# Patient Record
Sex: Female | Born: 1955 | Race: White | Hispanic: No | Marital: Married | Smoking: Never smoker
Health system: Southern US, Community
[De-identification: ages and names within clinical notes are randomized; demographics above are authoritative.]

## PROBLEM LIST (undated history)

## (undated) DIAGNOSIS — K900 Celiac disease: Secondary | ICD-10-CM

## (undated) DIAGNOSIS — R112 Nausea with vomiting, unspecified: Secondary | ICD-10-CM

## (undated) DIAGNOSIS — K9041 Non-celiac gluten sensitivity: Secondary | ICD-10-CM

## (undated) DIAGNOSIS — F419 Anxiety disorder, unspecified: Secondary | ICD-10-CM

## (undated) DIAGNOSIS — K219 Gastro-esophageal reflux disease without esophagitis: Secondary | ICD-10-CM

## (undated) DIAGNOSIS — J449 Chronic obstructive pulmonary disease, unspecified: Secondary | ICD-10-CM

## (undated) DIAGNOSIS — Z8719 Personal history of other diseases of the digestive system: Secondary | ICD-10-CM

## (undated) DIAGNOSIS — E038 Other specified hypothyroidism: Secondary | ICD-10-CM

## (undated) DIAGNOSIS — M810 Age-related osteoporosis without current pathological fracture: Secondary | ICD-10-CM

## (undated) DIAGNOSIS — N393 Stress incontinence (female) (male): Secondary | ICD-10-CM

## (undated) DIAGNOSIS — Z9889 Other specified postprocedural states: Secondary | ICD-10-CM

## (undated) DIAGNOSIS — L709 Acne, unspecified: Secondary | ICD-10-CM

## (undated) DIAGNOSIS — M79671 Pain in right foot: Secondary | ICD-10-CM

## (undated) DIAGNOSIS — Z86010 Personal history of colonic polyps: Secondary | ICD-10-CM

## (undated) DIAGNOSIS — N183 Chronic kidney disease, stage 3 unspecified: Secondary | ICD-10-CM

## (undated) DIAGNOSIS — Z905 Acquired absence of kidney: Secondary | ICD-10-CM

## (undated) DIAGNOSIS — Z8739 Personal history of other diseases of the musculoskeletal system and connective tissue: Secondary | ICD-10-CM

## (undated) DIAGNOSIS — M1990 Unspecified osteoarthritis, unspecified site: Secondary | ICD-10-CM

## (undated) DIAGNOSIS — E079 Disorder of thyroid, unspecified: Secondary | ICD-10-CM

## (undated) DIAGNOSIS — L719 Rosacea, unspecified: Secondary | ICD-10-CM

## (undated) DIAGNOSIS — Z789 Other specified health status: Secondary | ICD-10-CM

## (undated) HISTORY — DX: Anxiety disorder, unspecified: F41.9

## (undated) HISTORY — DX: Personal history of other diseases of the digestive system: Z87.19

## (undated) HISTORY — DX: Unspecified osteoarthritis, unspecified site: M19.90

## (undated) HISTORY — PX: NEPHRECTOMY: SHX65

## (undated) HISTORY — DX: Celiac disease: K90.0

## (undated) HISTORY — DX: Other specified postprocedural states: Z98.890

## (undated) HISTORY — PX: COLONOSCOPY: SHX174

## (undated) HISTORY — DX: Disorder of thyroid, unspecified: E07.9

## (undated) HISTORY — PX: TOTAL HIP ARTHROPLASTY: SHX124

## (undated) HISTORY — PX: BUNIONECTOMY: SHX129

---
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
## 2000-03-26 HISTORY — PX: HERNIA REPAIR: SHX51

## 2001-01-07 ENCOUNTER — Encounter (INDEPENDENT_AMBULATORY_CARE_PROVIDER_SITE_OTHER): Payer: Self-pay | Admitting: *Deleted

## 2001-01-07 ENCOUNTER — Ambulatory Visit (HOSPITAL_BASED_OUTPATIENT_CLINIC_OR_DEPARTMENT_OTHER): Admission: RE | Admit: 2001-01-07 | Discharge: 2001-01-07 | Payer: Self-pay | Admitting: Surgery

## 2002-04-28 ENCOUNTER — Emergency Department (HOSPITAL_COMMUNITY): Admission: EM | Admit: 2002-04-28 | Discharge: 2002-04-29 | Payer: Self-pay | Admitting: Emergency Medicine

## 2002-04-28 ENCOUNTER — Encounter: Payer: Self-pay | Admitting: Emergency Medicine

## 2004-05-02 ENCOUNTER — Other Ambulatory Visit: Admission: RE | Admit: 2004-05-02 | Discharge: 2004-05-02 | Payer: Self-pay | Admitting: Interventional Radiology

## 2004-05-02 ENCOUNTER — Encounter: Admission: RE | Admit: 2004-05-02 | Discharge: 2004-05-02 | Payer: Self-pay | Admitting: Internal Medicine

## 2006-07-12 ENCOUNTER — Encounter: Admission: RE | Admit: 2006-07-12 | Discharge: 2006-07-12 | Payer: Self-pay | Admitting: Internal Medicine

## 2006-08-13 ENCOUNTER — Emergency Department (HOSPITAL_COMMUNITY): Admission: EM | Admit: 2006-08-13 | Discharge: 2006-08-13 | Payer: Self-pay | Admitting: Emergency Medicine

## 2010-08-11 NOTE — Op Note (Signed)
[place]  Patient:    Regina Reyes, Regina Reyes Visit Number: [id number] MRN: [id number]          Service Type: DSU Location: [facility] Attending Physician:  Katha Cabal Dictated by:   Thornton Park Daphine Deutscher, M.D. Proc. Date: 01/07/01 Admit Date:  01/07/2001   CC:         Dr. Merlinda Frederick   Operative Report  PREOPERATIVE DIAGNOSIS:  Right inguinal hernia.  POSTOPERATIVE DIAGNOSIS:  Right indirect inguinal hernia.  OPERATION PERFORMED:  Open right inguinal herniorrhaphy with Sofradim mesh.  SURGEON:  Thornton Park. Daphine Deutscher, M.D.  ANESTHESIA:  MAC.  INDICATIONS FOR PROCEDURE:  The patient is a is a 55 year old lady who presented with a history of a bulging mass in her right groin.  This was identified and marked preoperatively.  DESCRIPTION OF PROCEDURE:  She was taken back to room three [facility] on January 07, 2001 and the entire area was prepped with Betadine and draped sterilely.  A regional block was administered using a mixture of Marcaine and lidocaine.  A small oblique incision was made in the right inguinal region and carried down through the fatty tissue to the external oblique which were then incised along their fibers to the ring.  I mobilized what was very visible as a prominent cord structure, very thickened and put a Penrose drain around it.  I then went proximally and found a very large sac which I eventually dissected free from the gubernaculum and then opened it and inserted my finger and felt the floor.  I then excised the excess of this as I closed it using a running 2-0 silk and then I twisted it and then pexed it again with the 2-0 silk.  The excess sac was removed and it was then allowed to retract up into the abdomen.  There was no bleeding.  I then identified the ilioinguinal nerve branch which had been separated from the cord structures and then divided the gubernaculum and allowed the  proximal gubernaculum to go up inside the abdomen.  I then closed the ring with a running 2-0 Prolene.  The distal gubernaculum had a suture ligature placed in it with 2-0 silk.  The floor did have sort of a bulging appearance to it and I went ahead and put a piece of Sofradim mesh to cover both the floor of the inguinal canal as well as the closure site.  This was cut to fit and sutured along the inguinal ligament with running 2-0 Prolene and medially to the internal oblique muscle and then carried out over the ring.  Next the external ring was closed again avoiding encumbrance on the nerve which was again identified.  The 2-0 Vicryl closed the external oblique.  The area had been irrigated well with saline.  4-0 Vicryl was used subcutaneously, subcuticularly and a 5-0 subcuticular interrupted closure was done.  Benzoin and Steri-Strips were used on the skin.  The patient will be given Percocet 10/325 to take for pain and will be followed up in the office in two to three weeks. Dictated by:   Thornton Park Daphine Deutscher, M.D. Attending Physician:  Katha Cabal DD:  01/07/01 TD:  01/07/01 Job: [id number]

## 2011-03-09 ENCOUNTER — Telehealth (INDEPENDENT_AMBULATORY_CARE_PROVIDER_SITE_OTHER): Payer: Self-pay | Admitting: Surgery

## 2011-04-06 ENCOUNTER — Ambulatory Visit (INDEPENDENT_AMBULATORY_CARE_PROVIDER_SITE_OTHER): Payer: Managed Care, Other (non HMO) | Admitting: Surgery

## 2011-04-06 ENCOUNTER — Encounter (INDEPENDENT_AMBULATORY_CARE_PROVIDER_SITE_OTHER): Payer: Self-pay | Admitting: Surgery

## 2011-04-06 VITALS — BP 126/88 | HR 64 | Temp 97.4°F | Resp 20 | Ht 72.0 in | Wt 207.0 lb

## 2011-04-06 DIAGNOSIS — G5790 Unspecified mononeuropathy of unspecified lower limb: Secondary | ICD-10-CM

## 2011-04-06 NOTE — Progress Notes (Signed)
PREOPERATIVE DIAGNOSIS: Right inguinal hernia.  POSTOPERATIVE DIAGNOSIS: Right indirect inguinal hernia.  OPERATION PERFORMED: Open right inguinal herniorrhaphy with Sofradim mesh.  SURGEON: Thornton Park. Daphine Deutscher, M.D. Regina Reyes returns today with pain in her right groin his side or I did a right inguinal herniorrhaphy for an indirect hernia and repaired this Sophridim mesh.  This was done in combination with ligating her gubernaculum and closing the ring. Now she has pain and can no longer cross her legs and is like she has nerve impingement. I can't feel any hernia recurrence when I examined her. I want to get a CT scan abdomen and pelvis to make sure that we're not missing something else. I will see her back after that. Plan CT abdomen and pelvis 4 right inguinal neuropathy.

## 2011-04-12 ENCOUNTER — Ambulatory Visit
Admission: RE | Admit: 2011-04-12 | Discharge: 2011-04-12 | Disposition: A | Discharge status: Home / Self Care | Payer: Managed Care, Other (non HMO) | Source: Ambulatory Visit | Attending: Surgery | Admitting: Surgery

## 2011-04-12 DIAGNOSIS — G5790 Unspecified mononeuropathy of unspecified lower limb: Secondary | ICD-10-CM

## 2011-04-12 MED ORDER — IOHEXOL 300 MG/ML  SOLN
125.0000 mL | Freq: Once | INTRAMUSCULAR | Status: AC | PRN
  Administered 2011-04-12: 125 mL via INTRAVENOUS

## 2011-04-18 ENCOUNTER — Telehealth (INDEPENDENT_AMBULATORY_CARE_PROVIDER_SITE_OTHER): Payer: Self-pay | Admitting: General Surgery

## 2011-04-18 NOTE — Telephone Encounter (Signed)
Notified the patient of CT report, she requested a copy, sent to her via mail

## 2012-01-25 ENCOUNTER — Ambulatory Visit (INDEPENDENT_AMBULATORY_CARE_PROVIDER_SITE_OTHER): Payer: Managed Care, Other (non HMO) | Admitting: Emergency Medicine

## 2012-01-25 VITALS — BP 128/80 | HR 105 | Temp 102.4°F | Resp 16 | Ht 72.0 in | Wt 199.8 lb

## 2012-01-25 DIAGNOSIS — J018 Other acute sinusitis: Secondary | ICD-10-CM

## 2012-01-25 DIAGNOSIS — J209 Acute bronchitis, unspecified: Secondary | ICD-10-CM

## 2012-01-25 DIAGNOSIS — J019 Acute sinusitis, unspecified: Secondary | ICD-10-CM

## 2012-01-25 MED ORDER — HYDROCOD POLST-CHLORPHEN POLST 10-8 MG/5ML PO LQCR: 5.0000 mL | Freq: Two times a day (BID) | ORAL | Status: DC | PRN

## 2012-01-25 MED ORDER — AMOXICILLIN-POT CLAVULANATE 875-125 MG PO TABS: 1.0000 | ORAL_TABLET | Freq: Two times a day (BID) | ORAL | Status: DC

## 2012-01-25 MED ORDER — PSEUDOEPHEDRINE-GUAIFENESIN ER 60-600 MG PO TB12: 1.0000 | ORAL_TABLET | Freq: Two times a day (BID) | ORAL | Status: AC

## 2012-01-25 NOTE — Progress Notes (Signed)
[facility] [address] [place] [address] [phone]  Date:  01/25/2012   Name:  Regina Reyes   DOB:  06-Jul-1955   MRN:  [id number]  PCP:  Gaspar Garbe, MD    Chief Complaint: Fever and Cough   History of Present Illness:  Regina Reyes is a 56 y.o. very pleasant female patient who presents with the following:  Ill since Wednesday night with a cough that was nonproductive.  Had fever and chills, not recorded.  Yesterday developed sore throat and hoarseness and purulent nasal congestion and drainage.  Pressure and pain in maxillary sinuses.  Worse when coughs.  Has generalized malaise and myalgias.    There is no problem list on file for this patient.   Past Medical History  Diagnosis Date  . History of inguinal hernia repair   . Thyroid disease   . Celiac syndrome   . Arthritis   . Anxiety     Past Surgical History  Procedure Date  . Appendectomy 1973  . Hernia repair 2002    RIH  . Colonoscopy 2004, 2009, 2012    History  Substance Use Topics  . Smoking status: Never Smoker   . Smokeless tobacco: Not on file  . Alcohol Use: Yes    Family History  Problem Relation Age of Onset  . Cancer Father     prostate  . Hypertension Father   . Diabetes Father   . Cancer Maternal Aunt     breast  . Thyroid disease Mother   . Thyroid disease Sister   . Arthritis Maternal Grandmother   . Heart disease Maternal Grandmother   . Heart disease Maternal Grandfather   . Heart disease Paternal Grandmother     Allergies  Allergen Reactions  . Penicillins     Medication list has been reviewed and updated.  Current Outpatient Prescriptions on File Prior to Visit  Medication Sig Dispense Refill  . Estradiol-Norethindrone Acet 0.5-0.1 MG per tablet       . SYNTHROID 100 MCG tablet daily.      Marland Kitchen desonide (DESOWEN) 0.05 % ointment Ad lib.      Marland Kitchen Emollient (HYLATOPIC PLUS) CREA Ad lib.      . multivitamin (THERAGRAN) per tablet Take 1 tablet by  mouth daily.      . Nutritional Supplements (PYCNOGENOL PO) Take 100 mg by mouth daily.        Review of Systems:  As per HPI, otherwise negative.    Physical Examination: Filed Vitals:   01/25/12 1324  BP: 128/80  Pulse: 105  Temp: 102.4 F (39.1 C)  Resp: 16   Filed Vitals:   01/25/12 1324  Height: 6' (1.829 m)  Weight: 199 lb 12.8 oz (90.629 kg)   Body mass index is 27.10 kg/(m^2). Ideal Body Weight: Weight in (lb) to have BMI = 25: 183.9   GEN: WDWN, NAD, Non-toxic, A & O x 3  No rash or sepsis or shortness of breath HEENT: Atraumatic, Normocephalic. Neck supple. No masses, No LAD.  Oropharynx negative  Tender maxillary sinuses Ears and Nose: No external deformity.  TM negative CV: RRR, No M/G/R. No JVD. No thrill. No extra heart sounds. PULM: CTA B, no wheezes, crackles, rhonchi. No retractions. No resp. distress. No accessory muscle use. ABD: S, NT, ND, +BS. No rebound. No HSM. EXTR: No c/c/e NEURO Normal gait.  PSYCH: Normally interactive. Conversant. Not depressed or anxious appearing.  Calm demeanor.    Assessment  and Plan: Sinusitis  Bronchitis augmentin mucinex d tussionex Follow up as needed  Carmelina Dane, MD

## 2012-01-28 NOTE — Progress Notes (Signed)
Reviewed and agree.

## 2014-04-26 DIAGNOSIS — Z905 Acquired absence of kidney: Secondary | ICD-10-CM

## 2014-04-26 HISTORY — DX: Acquired absence of kidney: Z90.5

## 2014-05-17 HISTORY — PX: LAPAROSCOPIC NEPHRECTOMY: SHX1930

## 2014-11-30 ENCOUNTER — Ambulatory Visit (INDEPENDENT_AMBULATORY_CARE_PROVIDER_SITE_OTHER): Payer: BLUE CROSS/BLUE SHIELD | Admitting: Emergency Medicine

## 2014-11-30 VITALS — BP 122/68 | HR 62 | Temp 98.2°F | Resp 16 | Ht 71.5 in | Wt 207.8 lb

## 2014-11-30 DIAGNOSIS — J0140 Acute pansinusitis, unspecified: Secondary | ICD-10-CM | POA: Diagnosis not present

## 2014-11-30 DIAGNOSIS — J209 Acute bronchitis, unspecified: Secondary | ICD-10-CM | POA: Diagnosis not present

## 2014-11-30 MED ORDER — HYDROCOD POLST-CPM POLST ER 10-8 MG/5ML PO SUER: 5.0000 mL | Freq: Two times a day (BID) | ORAL | Status: DC

## 2014-11-30 MED ORDER — CEFPROZIL 500 MG PO TABS: 500.0000 mg | ORAL_TABLET | Freq: Two times a day (BID) | ORAL | Status: DC

## 2014-11-30 MED ORDER — PSEUDOEPHEDRINE-GUAIFENESIN ER 60-600 MG PO TB12: 1.0000 | ORAL_TABLET | Freq: Two times a day (BID) | ORAL | Status: AC

## 2014-11-30 NOTE — Patient Instructions (Signed)

## 2014-11-30 NOTE — Progress Notes (Signed)
Subjective:  Patient ID: Regina Reyes, female    DOB: 27-Oct-1955  Age: 59 y.o. MRN: [id number]  CC: Cough and Sore Throat   HPI Regina Reyes presents  with nasal congestion postnasal drainage. She has sore throat. She has a cough productive of mucopurulent sputum. She has no nasal discharge. She has no fever or chills. No wheezing or shortness breath. No nausea or vomiting. She has no improvement with over-the-counter medication.  History Regina Reyes has a past medical history of History of inguinal hernia repair; Thyroid disease; Celiac syndrome; Arthritis; and Anxiety.   She has past surgical history that includes Appendectomy (1973); Hernia repair (2002); Colonoscopy (2004, 2009, 2012); and Nephrectomy.   Her  family history includes Arthritis in her maternal grandmother; Cancer in her father and maternal aunt; Diabetes in her father; Heart disease in her maternal grandfather, maternal grandmother, and paternal grandmother; Hypertension in her father; Thyroid disease in her mother and sister.  She   reports that she has never smoked. She does not have any smokeless tobacco history on file. She reports that she drinks alcohol. She reports that she does not use illicit drugs.  Outpatient Prescriptions Prior to Visit  Medication Sig Dispense Refill  . desonide (DESOWEN) 0.05 % ointment Ad lib.    Marland Kitchen Emollient (HYLATOPIC PLUS) CREA Ad lib.    . Estradiol-Norethindrone Acet 0.5-0.1 MG per tablet     . multivitamin (THERAGRAN) per tablet Take 1 tablet by mouth daily.    . Nutritional Supplements (PYCNOGENOL PO) Take 100 mg by mouth daily.    Marland Kitchen SYNTHROID 100 MCG tablet daily.    Marland Kitchen amoxicillin-clavulanate (AUGMENTIN) 875-125 MG per tablet Take 1 tablet by mouth 2 (two) times daily. 20 tablet 0  . chlorpheniramine-HYDROcodone (TUSSIONEX PENNKINETIC ER) 10-8 MG/5ML LQCR Take 5 mLs by mouth every 12 (twelve) hours as needed (cough). 60 mL 0   No facility-administered medications prior to visit.     Social History   Social History  . Marital Status: Married    Spouse Name: N/A  . Number of Children: N/A  . Years of Education: N/A   Social History Main Topics  . Smoking status: Never Smoker   . Smokeless tobacco: None  . Alcohol Use: Yes  . Drug Use: No  . Sexual Activity: Yes    Birth Control/ Protection: None   Other Topics Concern  . None   Social History Narrative     Review of Systems  Constitutional: Negative for fever, chills and appetite change.  HENT: Negative for congestion, ear pain, postnasal drip, sinus pressure and sore throat.   Eyes: Negative for pain and redness.  Respiratory: Negative for cough, shortness of breath and wheezing.   Cardiovascular: Negative for leg swelling.  Gastrointestinal: Negative for nausea, vomiting, abdominal pain, diarrhea, constipation and blood in stool.  Endocrine: Negative for polyuria.  Genitourinary: Negative for dysuria, urgency, frequency and flank pain.  Musculoskeletal: Negative for gait problem.  Skin: Negative for rash.  Neurological: Negative for weakness and headaches.  Psychiatric/Behavioral: Negative for confusion and decreased concentration. The patient is not nervous/anxious.     Objective:  BP 122/68 mmHg  Pulse 62  Temp(Src) 98.2 F (36.8 C) (Oral)  Resp 16  Ht 5' 11.5" (1.816 m)  Wt 207 lb 12.8 oz (94.257 kg)  BMI 28.58 kg/m2  SpO2 93%  Physical Exam  Constitutional: She is oriented to person, place, and time. She appears well-developed and well-nourished. No distress.  HENT:  Head: Normocephalic and atraumatic.  Right Ear: External ear normal.  Left Ear: External ear normal.  Nose: Nose normal.  Eyes: Conjunctivae and EOM are normal. Pupils are equal, round, and reactive to light. No scleral icterus.  Neck: Normal range of motion. Neck supple. No tracheal deviation present.  Cardiovascular: Normal rate, regular rhythm and normal heart sounds.   Pulmonary/Chest: Effort normal. No  respiratory distress. She has no wheezes. She has no rales.  Abdominal: She exhibits no mass. There is no tenderness. There is no rebound and no guarding.  Musculoskeletal: She exhibits no edema.  Lymphadenopathy:    She has no cervical adenopathy.  Neurological: She is alert and oriented to person, place, and time. Coordination normal.  Skin: Skin is warm and dry. No rash noted.  Psychiatric: She has a normal mood and affect. Her behavior is normal.      Assessment & Plan:   Regina Reyes was seen today for cough and sore throat.  Diagnoses and all orders for this visit:  Acute pansinusitis, recurrence not specified  Acute bronchitis, unspecified organism  Other orders -     cefPROZIL (CEFZIL) 500 MG tablet; Take 1 tablet (500 mg total) by mouth 2 (two) times daily. -     pseudoephedrine-guaifenesin (MUCINEX D) 60-600 MG per tablet; Take 1 tablet by mouth every 12 (twelve) hours. -     chlorpheniramine-HYDROcodone (TUSSIONEX PENNKINETIC ER) 10-8 MG/5ML SUER; Take 5 mLs by mouth 2 (two) times daily.  I have discontinued Regina Reyes's amoxicillin-clavulanate and chlorpheniramine-HYDROcodone. I am also having her start on cefPROZIL, pseudoephedrine-guaifenesin, and chlorpheniramine-HYDROcodone. Additionally, I am having her maintain her SYNTHROID, Estradiol-Norethindrone Acet, desonide, HYLATOPIC PLUS, multivitamin, Nutritional Supplements (PYCNOGENOL PO), and Estradiol.  Meds ordered this encounter  Medications  . Estradiol (VAGIFEM) 10 MCG TABS vaginal tablet    Sig: Place vaginally.  . cefPROZIL (CEFZIL) 500 MG tablet    Sig: Take 1 tablet (500 mg total) by mouth 2 (two) times daily.    Dispense:  20 tablet    Refill:  0  . pseudoephedrine-guaifenesin (MUCINEX D) 60-600 MG per tablet    Sig: Take 1 tablet by mouth every 12 (twelve) hours.    Dispense:  18 tablet    Refill:  0  . chlorpheniramine-HYDROcodone (TUSSIONEX PENNKINETIC ER) 10-8 MG/5ML SUER    Sig: Take 5 mLs by mouth 2 (two)  times daily.    Dispense:  60 mL    Refill:  0    Appropriate red flag conditions were discussed with the patient as well as actions that should be taken.  Patient expressed his understanding.  Follow-up: Return if symptoms worsen or fail to improve.  Carmelina Dane, MD

## 2014-12-03 ENCOUNTER — Telehealth: Payer: Self-pay

## 2014-12-03 NOTE — Telephone Encounter (Signed)
Pt is still not feeling any better and would like to know what to do next   Best number [phone]

## 2014-12-05 ENCOUNTER — Ambulatory Visit (INDEPENDENT_AMBULATORY_CARE_PROVIDER_SITE_OTHER): Payer: BLUE CROSS/BLUE SHIELD | Admitting: Family Medicine

## 2014-12-05 ENCOUNTER — Ambulatory Visit (INDEPENDENT_AMBULATORY_CARE_PROVIDER_SITE_OTHER): Payer: BLUE CROSS/BLUE SHIELD

## 2014-12-05 VITALS — BP 110/76 | HR 75 | Temp 98.5°F | Resp 18 | Ht 71.5 in | Wt 204.0 lb

## 2014-12-05 DIAGNOSIS — R05 Cough: Secondary | ICD-10-CM

## 2014-12-05 DIAGNOSIS — R509 Fever, unspecified: Secondary | ICD-10-CM

## 2014-12-05 DIAGNOSIS — J040 Acute laryngitis: Secondary | ICD-10-CM

## 2014-12-05 DIAGNOSIS — R059 Cough, unspecified: Secondary | ICD-10-CM

## 2014-12-05 LAB — POCT CBC
GRANULOCYTE PERCENT: 69.3 % (ref 37–80)
HCT, POC: 40.3 % (ref 37.7–47.9)
HEMOGLOBIN: 13.2 g/dL (ref 12.2–16.2)
Lymph, poc: 1.4 (ref 0.6–3.4)
MCH: 28.7 pg (ref 27–31.2)
MCHC: 32.7 g/dL (ref 31.8–35.4)
MCV: 87.7 fL (ref 80–97)
MID (CBC): 0.7 (ref 0–0.9)
MPV: 8.1 fL (ref 0–99.8)
POC GRANULOCYTE: 4.7 (ref 2–6.9)
POC LYMPH PERCENT: 20.6 %L (ref 10–50)
POC MID %: 10.1 % (ref 0–12)
Platelet Count, POC: 266 10*3/uL (ref 142–424)
RBC: 4.6 M/uL (ref 4.04–5.48)
RDW, POC: 13.3 %
WBC: 6.8 10*3/uL (ref 4.6–10.2)

## 2014-12-05 MED ORDER — AZITHROMYCIN 250 MG PO TABS: ORAL_TABLET | ORAL | Status: DC

## 2014-12-05 NOTE — Progress Notes (Signed)
[facility] [address], [place] [address] [phone]  Date:  12/05/2014   Name:  Regina Reyes   DOB:  07-15-1955   MRN:  [id number]  PCP:  Gaspar Garbe, MD    Chief Complaint: Follow-up; Sinusitis; Cough; and Laryngitis   History of Present Illness:  Regina Reyes is a 59 y.o. very pleasant female patient who presents with the following:  She was here on 9/6 and dx with sinus infection-  She was started on cefzil, mucinex, tussionex She states that she has "gotten worse," she actually started getting a fever over the last 4 days or so.   She has been ill for about 10 days total now  She went to her PCP on 9/1- got a flu shot. The next day she had a scratchy throat and a bit of a cough.    The sx develop from there  Her voice has been hoarse, and she is coughing up some material She had a temp to the 99s.   No body aches, mild chills No GI symptoms Her husband has had a URI  She just donated a kidney to her husband in February of this year.  They are both doing well in this regard. She is disappointed because she called on 9/9 to ask for advice and did not get a call back   There are no active problems to display for this patient.   Past Medical History  Diagnosis Date  . History of inguinal hernia repair   . Thyroid disease   . Celiac syndrome   . Arthritis   . Anxiety     Past Surgical History  Procedure Laterality Date  . Appendectomy  1973  . Hernia repair  2002    RIH  . Colonoscopy  2004, 2009, 2012  . Nephrectomy      Social History  Substance Use Topics  . Smoking status: Never Smoker   . Smokeless tobacco: None  . Alcohol Use: Yes    Family History  Problem Relation Age of Onset  . Cancer Father     prostate  . Hypertension Father   . Diabetes Father   . Cancer Maternal Aunt     breast  . Thyroid disease Mother   . Thyroid disease Sister   . Arthritis Maternal Grandmother   . Heart disease Maternal Grandmother    . Heart disease Maternal Grandfather   . Heart disease Paternal Grandmother     Allergies  Allergen Reactions  . Penicillins     Medication list has been reviewed and updated.  Current Outpatient Prescriptions on File Prior to Visit  Medication Sig Dispense Refill  . cefPROZIL (CEFZIL) 500 MG tablet Take 1 tablet (500 mg total) by mouth 2 (two) times daily. 20 tablet 0  . chlorpheniramine-HYDROcodone (TUSSIONEX PENNKINETIC ER) 10-8 MG/5ML SUER Take 5 mLs by mouth 2 (two) times daily. 60 mL 0  . desonide (DESOWEN) 0.05 % ointment Ad lib.    Marland Kitchen Emollient (HYLATOPIC PLUS) CREA Ad lib.    . Estradiol (VAGIFEM) 10 MCG TABS vaginal tablet Place vaginally.    . Estradiol-Norethindrone Acet 0.5-0.1 MG per tablet     . multivitamin (THERAGRAN) per tablet Take 1 tablet by mouth daily.    . Nutritional Supplements (PYCNOGENOL PO) Take 100 mg by mouth daily.    . pseudoephedrine-guaifenesin (MUCINEX D) 60-600 MG per tablet Take 1 tablet by mouth every 12 (twelve) hours. 18 tablet 0  . SYNTHROID 100  MCG tablet daily.     No current facility-administered medications on file prior to visit.    Review of Systems:  As per HPI- otherwise negative.   Physical Examination: Filed Vitals:   12/05/14 1102  BP: 110/76  Pulse: 75  Temp: 98.5 F (36.9 C)  Resp: 18   Filed Vitals:   12/05/14 1102  Height: 5' 11.5" (1.816 m)  Weight: 204 lb (92.534 kg)   Body mass index is 28.06 kg/(m^2). Ideal Body Weight: Weight in (lb) to have BMI = 25: 181.4  GEN: WDWN, NAD, Non-toxic, A & O x 3, tall/ large build, looks well Voice is hoarse HEENT: Atraumatic, Normocephalic. Neck supple. No masses, No LAD.  Bilateral TM wnl, oropharynx normal.  PEERL,EOMI.   Ears and Nose: No external deformity. CV: RRR, No M/G/R. No JVD. No thrill. No extra heart sounds. PULM: CTA B, no wheezes, crackles, rhonchi. No retractions. No resp. distress. No accessory muscle use. EXTR: No c/c/e NEURO Normal gait.  PSYCH:  Normally interactive. Conversant. Not depressed or anxious appearing.  Calm demeanor.   [facility] reading (PRIMARY) by  Dr. Patsy Lager. CXR:  Negative  CHEST 2 VIEW  COMPARISON: None.  FINDINGS: The cardiomediastinal silhouette is within normal limits. The lungs are mildly hyperinflated. No airspace consolidation, pleural effusion, or pneumothorax is seen. Mild thoracic spondylosis is noted.  IMPRESSION: Hyperinflation without evidence of acute airspace disease.   Results for orders placed or performed in visit on 12/05/14  POCT CBC  Result Value Ref Range   WBC 6.8 4.6 - 10.2 K/uL   Lymph, poc 1.4 0.6 - 3.4   POC LYMPH PERCENT 20.6 10 - 50 %L   MID (cbc) 0.7 0 - 0.9   POC MID % 10.1 0 - 12 %M   POC Granulocyte 4.7 2 - 6.9   Granulocyte percent 69.3 37 - 80 %G   RBC 4.60 4.04 - 5.48 M/uL   Hemoglobin 13.2 12.2 - 16.2 g/dL   HCT, POC 27.2 53.6 - 47.9 %   MCV 87.7 80 - 97 fL   MCH, POC 28.7 27 - 31.2 pg   MCHC 32.7 31.8 - 35.4 g/dL   RDW, POC 64.4 %   Platelet Count, POC 266 142 - 424 K/uL   MPV 8.1 0 - 99.8 fL     Assessment and Plan: Cough - Plan: DG Chest 2 View, azithromycin (ZITHROMAX) 250 MG tablet  Low grade fever - Plan: POCT CBC, azithromycin (ZITHROMAX) 250 MG tablet  Laryngitis  Here today with persistent cough, low grade fevers, she does not feel that she has has any sinus sx Start on azithromycin- stop cefzil.  She will let us know if not feeling better soon  Signed Abbe Amsterdam, MD

## 2014-12-05 NOTE — Patient Instructions (Signed)
Your chest xray looks good to me- I will let you know if the radiologist says anything else about it.   We will change you from the cefzil to the azithromcyin- take 2 pills tomorrow and then 1 every day until gone  Please let me know if you do not feel better soon!

## 2014-12-06 NOTE — Telephone Encounter (Signed)
Patient came in to be seen it looks like on 9/11 and saw Dr. Patsy Lager.

## 2015-06-30 DIAGNOSIS — M9904 Segmental and somatic dysfunction of sacral region: Secondary | ICD-10-CM | POA: Diagnosis not present

## 2015-07-07 DIAGNOSIS — M9904 Segmental and somatic dysfunction of sacral region: Secondary | ICD-10-CM | POA: Diagnosis not present

## 2015-07-14 DIAGNOSIS — M609 Myositis, unspecified: Secondary | ICD-10-CM | POA: Diagnosis not present

## 2015-07-14 DIAGNOSIS — M9904 Segmental and somatic dysfunction of sacral region: Secondary | ICD-10-CM | POA: Diagnosis not present

## 2015-07-14 DIAGNOSIS — M9901 Segmental and somatic dysfunction of cervical region: Secondary | ICD-10-CM | POA: Diagnosis not present

## 2015-07-14 DIAGNOSIS — M25551 Pain in right hip: Secondary | ICD-10-CM | POA: Diagnosis not present

## 2015-07-20 DIAGNOSIS — M609 Myositis, unspecified: Secondary | ICD-10-CM | POA: Diagnosis not present

## 2015-07-20 DIAGNOSIS — M9901 Segmental and somatic dysfunction of cervical region: Secondary | ICD-10-CM | POA: Diagnosis not present

## 2015-07-20 DIAGNOSIS — M25551 Pain in right hip: Secondary | ICD-10-CM | POA: Diagnosis not present

## 2015-07-20 DIAGNOSIS — M9904 Segmental and somatic dysfunction of sacral region: Secondary | ICD-10-CM | POA: Diagnosis not present

## 2015-08-04 DIAGNOSIS — M9904 Segmental and somatic dysfunction of sacral region: Secondary | ICD-10-CM | POA: Diagnosis not present

## 2015-08-11 DIAGNOSIS — M9904 Segmental and somatic dysfunction of sacral region: Secondary | ICD-10-CM | POA: Diagnosis not present

## 2015-08-15 DIAGNOSIS — K641 Second degree hemorrhoids: Secondary | ICD-10-CM | POA: Diagnosis not present

## 2015-08-18 DIAGNOSIS — M9901 Segmental and somatic dysfunction of cervical region: Secondary | ICD-10-CM | POA: Diagnosis not present

## 2015-08-18 DIAGNOSIS — M609 Myositis, unspecified: Secondary | ICD-10-CM | POA: Diagnosis not present

## 2015-08-18 DIAGNOSIS — M9904 Segmental and somatic dysfunction of sacral region: Secondary | ICD-10-CM | POA: Diagnosis not present

## 2015-08-18 DIAGNOSIS — M25551 Pain in right hip: Secondary | ICD-10-CM | POA: Diagnosis not present

## 2015-08-25 DIAGNOSIS — M9904 Segmental and somatic dysfunction of sacral region: Secondary | ICD-10-CM | POA: Diagnosis not present

## 2015-08-25 DIAGNOSIS — M609 Myositis, unspecified: Secondary | ICD-10-CM | POA: Diagnosis not present

## 2015-08-25 DIAGNOSIS — M25551 Pain in right hip: Secondary | ICD-10-CM | POA: Diagnosis not present

## 2015-08-25 DIAGNOSIS — M9901 Segmental and somatic dysfunction of cervical region: Secondary | ICD-10-CM | POA: Diagnosis not present

## 2015-09-01 DIAGNOSIS — M25551 Pain in right hip: Secondary | ICD-10-CM | POA: Diagnosis not present

## 2015-09-01 DIAGNOSIS — M9904 Segmental and somatic dysfunction of sacral region: Secondary | ICD-10-CM | POA: Diagnosis not present

## 2015-09-01 DIAGNOSIS — M9901 Segmental and somatic dysfunction of cervical region: Secondary | ICD-10-CM | POA: Diagnosis not present

## 2015-09-01 DIAGNOSIS — M609 Myositis, unspecified: Secondary | ICD-10-CM | POA: Diagnosis not present

## 2015-09-08 DIAGNOSIS — M25551 Pain in right hip: Secondary | ICD-10-CM | POA: Diagnosis not present

## 2015-09-08 DIAGNOSIS — M9904 Segmental and somatic dysfunction of sacral region: Secondary | ICD-10-CM | POA: Diagnosis not present

## 2015-09-08 DIAGNOSIS — M9901 Segmental and somatic dysfunction of cervical region: Secondary | ICD-10-CM | POA: Diagnosis not present

## 2015-09-08 DIAGNOSIS — M609 Myositis, unspecified: Secondary | ICD-10-CM | POA: Diagnosis not present

## 2015-09-22 DIAGNOSIS — M609 Myositis, unspecified: Secondary | ICD-10-CM | POA: Diagnosis not present

## 2015-09-22 DIAGNOSIS — M9901 Segmental and somatic dysfunction of cervical region: Secondary | ICD-10-CM | POA: Diagnosis not present

## 2015-09-22 DIAGNOSIS — M25551 Pain in right hip: Secondary | ICD-10-CM | POA: Diagnosis not present

## 2015-09-22 DIAGNOSIS — M9904 Segmental and somatic dysfunction of sacral region: Secondary | ICD-10-CM | POA: Diagnosis not present

## 2015-10-06 DIAGNOSIS — M9901 Segmental and somatic dysfunction of cervical region: Secondary | ICD-10-CM | POA: Diagnosis not present

## 2015-10-06 DIAGNOSIS — M9904 Segmental and somatic dysfunction of sacral region: Secondary | ICD-10-CM | POA: Diagnosis not present

## 2015-10-06 DIAGNOSIS — M25551 Pain in right hip: Secondary | ICD-10-CM | POA: Diagnosis not present

## 2015-10-06 DIAGNOSIS — M609 Myositis, unspecified: Secondary | ICD-10-CM | POA: Diagnosis not present

## 2015-10-20 DIAGNOSIS — M9904 Segmental and somatic dysfunction of sacral region: Secondary | ICD-10-CM | POA: Diagnosis not present

## 2015-10-20 DIAGNOSIS — M609 Myositis, unspecified: Secondary | ICD-10-CM | POA: Diagnosis not present

## 2015-10-20 DIAGNOSIS — M25551 Pain in right hip: Secondary | ICD-10-CM | POA: Diagnosis not present

## 2015-10-20 DIAGNOSIS — M9901 Segmental and somatic dysfunction of cervical region: Secondary | ICD-10-CM | POA: Diagnosis not present

## 2015-12-01 DIAGNOSIS — M9904 Segmental and somatic dysfunction of sacral region: Secondary | ICD-10-CM | POA: Diagnosis not present

## 2015-12-01 DIAGNOSIS — M609 Myositis, unspecified: Secondary | ICD-10-CM | POA: Diagnosis not present

## 2015-12-01 DIAGNOSIS — M9901 Segmental and somatic dysfunction of cervical region: Secondary | ICD-10-CM | POA: Diagnosis not present

## 2015-12-01 DIAGNOSIS — M25551 Pain in right hip: Secondary | ICD-10-CM | POA: Diagnosis not present

## 2015-12-08 DIAGNOSIS — M609 Myositis, unspecified: Secondary | ICD-10-CM | POA: Diagnosis not present

## 2015-12-08 DIAGNOSIS — M9901 Segmental and somatic dysfunction of cervical region: Secondary | ICD-10-CM | POA: Diagnosis not present

## 2015-12-08 DIAGNOSIS — M25551 Pain in right hip: Secondary | ICD-10-CM | POA: Diagnosis not present

## 2015-12-08 DIAGNOSIS — M9904 Segmental and somatic dysfunction of sacral region: Secondary | ICD-10-CM | POA: Diagnosis not present

## 2015-12-13 DIAGNOSIS — M9904 Segmental and somatic dysfunction of sacral region: Secondary | ICD-10-CM | POA: Diagnosis not present

## 2015-12-13 DIAGNOSIS — M25551 Pain in right hip: Secondary | ICD-10-CM | POA: Diagnosis not present

## 2015-12-13 DIAGNOSIS — M609 Myositis, unspecified: Secondary | ICD-10-CM | POA: Diagnosis not present

## 2015-12-13 DIAGNOSIS — M9901 Segmental and somatic dysfunction of cervical region: Secondary | ICD-10-CM | POA: Diagnosis not present

## 2015-12-29 DIAGNOSIS — M25551 Pain in right hip: Secondary | ICD-10-CM | POA: Diagnosis not present

## 2015-12-29 DIAGNOSIS — M609 Myositis, unspecified: Secondary | ICD-10-CM | POA: Diagnosis not present

## 2015-12-29 DIAGNOSIS — M9904 Segmental and somatic dysfunction of sacral region: Secondary | ICD-10-CM | POA: Diagnosis not present

## 2015-12-29 DIAGNOSIS — M9901 Segmental and somatic dysfunction of cervical region: Secondary | ICD-10-CM | POA: Diagnosis not present

## 2016-01-19 DIAGNOSIS — M9904 Segmental and somatic dysfunction of sacral region: Secondary | ICD-10-CM | POA: Diagnosis not present

## 2016-01-19 DIAGNOSIS — M609 Myositis, unspecified: Secondary | ICD-10-CM | POA: Diagnosis not present

## 2016-01-19 DIAGNOSIS — M25551 Pain in right hip: Secondary | ICD-10-CM | POA: Diagnosis not present

## 2016-01-19 DIAGNOSIS — M9901 Segmental and somatic dysfunction of cervical region: Secondary | ICD-10-CM | POA: Diagnosis not present

## 2016-01-23 DIAGNOSIS — M545 Low back pain: Secondary | ICD-10-CM | POA: Diagnosis not present

## 2016-01-23 DIAGNOSIS — R7301 Impaired fasting glucose: Secondary | ICD-10-CM | POA: Diagnosis not present

## 2016-01-23 DIAGNOSIS — M5136 Other intervertebral disc degeneration, lumbar region: Secondary | ICD-10-CM | POA: Diagnosis not present

## 2016-01-23 DIAGNOSIS — Z6828 Body mass index (BMI) 28.0-28.9, adult: Secondary | ICD-10-CM | POA: Diagnosis not present

## 2016-01-26 DIAGNOSIS — M25551 Pain in right hip: Secondary | ICD-10-CM | POA: Diagnosis not present

## 2016-01-26 DIAGNOSIS — M609 Myositis, unspecified: Secondary | ICD-10-CM | POA: Diagnosis not present

## 2016-01-26 DIAGNOSIS — M9904 Segmental and somatic dysfunction of sacral region: Secondary | ICD-10-CM | POA: Diagnosis not present

## 2016-01-26 DIAGNOSIS — M9901 Segmental and somatic dysfunction of cervical region: Secondary | ICD-10-CM | POA: Diagnosis not present

## 2016-01-30 DIAGNOSIS — M2560 Stiffness of unspecified joint, not elsewhere classified: Secondary | ICD-10-CM | POA: Diagnosis not present

## 2016-01-30 DIAGNOSIS — M6281 Muscle weakness (generalized): Secondary | ICD-10-CM | POA: Diagnosis not present

## 2016-01-30 DIAGNOSIS — M545 Low back pain: Secondary | ICD-10-CM | POA: Diagnosis not present

## 2016-02-03 DIAGNOSIS — M5489 Other dorsalgia: Secondary | ICD-10-CM | POA: Diagnosis not present

## 2016-02-03 DIAGNOSIS — M545 Low back pain: Secondary | ICD-10-CM | POA: Diagnosis not present

## 2016-02-03 DIAGNOSIS — M6281 Muscle weakness (generalized): Secondary | ICD-10-CM | POA: Diagnosis not present

## 2016-02-03 DIAGNOSIS — M2560 Stiffness of unspecified joint, not elsewhere classified: Secondary | ICD-10-CM | POA: Diagnosis not present

## 2016-02-06 DIAGNOSIS — M5489 Other dorsalgia: Secondary | ICD-10-CM | POA: Diagnosis not present

## 2016-02-06 DIAGNOSIS — M2560 Stiffness of unspecified joint, not elsewhere classified: Secondary | ICD-10-CM | POA: Diagnosis not present

## 2016-02-06 DIAGNOSIS — M6281 Muscle weakness (generalized): Secondary | ICD-10-CM | POA: Diagnosis not present

## 2016-02-06 DIAGNOSIS — M545 Low back pain: Secondary | ICD-10-CM | POA: Diagnosis not present

## 2016-02-09 DIAGNOSIS — M545 Low back pain: Secondary | ICD-10-CM | POA: Diagnosis not present

## 2016-02-09 DIAGNOSIS — M2560 Stiffness of unspecified joint, not elsewhere classified: Secondary | ICD-10-CM | POA: Diagnosis not present

## 2016-02-09 DIAGNOSIS — M5489 Other dorsalgia: Secondary | ICD-10-CM | POA: Diagnosis not present

## 2016-02-09 DIAGNOSIS — M6281 Muscle weakness (generalized): Secondary | ICD-10-CM | POA: Diagnosis not present

## 2016-02-14 DIAGNOSIS — M6281 Muscle weakness (generalized): Secondary | ICD-10-CM | POA: Diagnosis not present

## 2016-02-14 DIAGNOSIS — M545 Low back pain: Secondary | ICD-10-CM | POA: Diagnosis not present

## 2016-02-14 DIAGNOSIS — M2560 Stiffness of unspecified joint, not elsewhere classified: Secondary | ICD-10-CM | POA: Diagnosis not present

## 2016-02-14 DIAGNOSIS — M5489 Other dorsalgia: Secondary | ICD-10-CM | POA: Diagnosis not present

## 2016-02-22 DIAGNOSIS — M545 Low back pain: Secondary | ICD-10-CM | POA: Diagnosis not present

## 2016-02-22 DIAGNOSIS — M5489 Other dorsalgia: Secondary | ICD-10-CM | POA: Diagnosis not present

## 2016-02-22 DIAGNOSIS — M2560 Stiffness of unspecified joint, not elsewhere classified: Secondary | ICD-10-CM | POA: Diagnosis not present

## 2016-02-22 DIAGNOSIS — M6281 Muscle weakness (generalized): Secondary | ICD-10-CM | POA: Diagnosis not present

## 2016-02-24 DIAGNOSIS — M2560 Stiffness of unspecified joint, not elsewhere classified: Secondary | ICD-10-CM | POA: Diagnosis not present

## 2016-02-24 DIAGNOSIS — M6281 Muscle weakness (generalized): Secondary | ICD-10-CM | POA: Diagnosis not present

## 2016-02-24 DIAGNOSIS — M545 Low back pain: Secondary | ICD-10-CM | POA: Diagnosis not present

## 2016-02-24 DIAGNOSIS — M5489 Other dorsalgia: Secondary | ICD-10-CM | POA: Diagnosis not present

## 2016-02-28 DIAGNOSIS — M6281 Muscle weakness (generalized): Secondary | ICD-10-CM | POA: Diagnosis not present

## 2016-02-28 DIAGNOSIS — M2560 Stiffness of unspecified joint, not elsewhere classified: Secondary | ICD-10-CM | POA: Diagnosis not present

## 2016-02-28 DIAGNOSIS — M545 Low back pain: Secondary | ICD-10-CM | POA: Diagnosis not present

## 2016-02-28 DIAGNOSIS — M5489 Other dorsalgia: Secondary | ICD-10-CM | POA: Diagnosis not present

## 2016-03-02 DIAGNOSIS — M5489 Other dorsalgia: Secondary | ICD-10-CM | POA: Diagnosis not present

## 2016-03-02 DIAGNOSIS — M545 Low back pain: Secondary | ICD-10-CM | POA: Diagnosis not present

## 2016-03-02 DIAGNOSIS — M2560 Stiffness of unspecified joint, not elsewhere classified: Secondary | ICD-10-CM | POA: Diagnosis not present

## 2016-03-02 DIAGNOSIS — M6281 Muscle weakness (generalized): Secondary | ICD-10-CM | POA: Diagnosis not present

## 2016-03-06 DIAGNOSIS — M545 Low back pain: Secondary | ICD-10-CM | POA: Diagnosis not present

## 2016-03-06 DIAGNOSIS — M2560 Stiffness of unspecified joint, not elsewhere classified: Secondary | ICD-10-CM | POA: Diagnosis not present

## 2016-03-06 DIAGNOSIS — M5489 Other dorsalgia: Secondary | ICD-10-CM | POA: Diagnosis not present

## 2016-03-06 DIAGNOSIS — M6281 Muscle weakness (generalized): Secondary | ICD-10-CM | POA: Diagnosis not present

## 2016-03-09 DIAGNOSIS — M2560 Stiffness of unspecified joint, not elsewhere classified: Secondary | ICD-10-CM | POA: Diagnosis not present

## 2016-03-09 DIAGNOSIS — M5489 Other dorsalgia: Secondary | ICD-10-CM | POA: Diagnosis not present

## 2016-03-09 DIAGNOSIS — M545 Low back pain: Secondary | ICD-10-CM | POA: Diagnosis not present

## 2016-03-09 DIAGNOSIS — M6281 Muscle weakness (generalized): Secondary | ICD-10-CM | POA: Diagnosis not present

## 2016-03-12 DIAGNOSIS — M5489 Other dorsalgia: Secondary | ICD-10-CM | POA: Diagnosis not present

## 2016-03-12 DIAGNOSIS — M6281 Muscle weakness (generalized): Secondary | ICD-10-CM | POA: Diagnosis not present

## 2016-03-12 DIAGNOSIS — M545 Low back pain: Secondary | ICD-10-CM | POA: Diagnosis not present

## 2016-03-12 DIAGNOSIS — M2560 Stiffness of unspecified joint, not elsewhere classified: Secondary | ICD-10-CM | POA: Diagnosis not present

## 2016-03-18 ENCOUNTER — Encounter (HOSPITAL_BASED_OUTPATIENT_CLINIC_OR_DEPARTMENT_OTHER): Payer: Self-pay | Admitting: *Deleted

## 2016-03-18 ENCOUNTER — Emergency Department (HOSPITAL_BASED_OUTPATIENT_CLINIC_OR_DEPARTMENT_OTHER): Payer: BLUE CROSS/BLUE SHIELD

## 2016-03-18 ENCOUNTER — Emergency Department (HOSPITAL_BASED_OUTPATIENT_CLINIC_OR_DEPARTMENT_OTHER)
Admission: EM | Admit: 2016-03-18 | Discharge: 2016-03-18 | Disposition: A | Discharge status: Home / Self Care | Payer: BLUE CROSS/BLUE SHIELD | Attending: Emergency Medicine | Admitting: Emergency Medicine

## 2016-03-18 DIAGNOSIS — M25572 Pain in left ankle and joints of left foot: Secondary | ICD-10-CM | POA: Diagnosis not present

## 2016-03-18 MED ORDER — DICLOFENAC SODIUM 1 % TD GEL: 2.0000 g | Freq: Four times a day (QID) | TRANSDERMAL | 0 refills | Status: DC

## 2016-03-18 MED ORDER — ACETAMINOPHEN 500 MG PO TABS
1000.0000 mg | ORAL_TABLET | Freq: Once | ORAL | Status: AC
  Administered 2016-03-18: 1000 mg via ORAL

## 2016-03-18 MED ORDER — ACETAMINOPHEN 500 MG PO TABS
ORAL_TABLET | ORAL | Status: AC
  Filled 2016-03-18: qty 2

## 2016-03-18 NOTE — ED Triage Notes (Signed)
Left ankle pain. No known injury. She has been standing on her feet long hours this week. Foot is hot and swollen.

## 2016-03-18 NOTE — ED Provider Notes (Signed)
[facility]-EMERGENCY DEPT Provider Note   CSN: [id number] Arrival date & time: 03/18/16  1903  By signing my name below, I, Majel HomerPeyton Lee, attest that this documentation has been prepared under the direction and in the presence of non-physician practitioner, Harolyn Rutherford , PA-C. Electronically Signed: Majel HomerPeyton Lee, Scribe. 03/18/2016. 8:41 PM.  History   Chief Complaint No chief complaint on file.  The history is provided by the patient. No language interpreter was used.   HPI Comments: Regina Reyes is a 60 y.o. female with PMHx of arthritis, who presents to the Emergency Department complaining of 7/10, left ankle pain that began last night. Pt reports she worked 65 hours on her feet this week, which is a lot more time on her feet than she usually endures. She states she soaked her foot in epsom salt last night and returned to work again this morning but experienced 10/10 pain upon returning home. She notes she cannot take oral NSAIDs because she only has 1 kidney but has used diclofenac in the past without complications. Patient denies neuro deficits, known trauma, or any other complaints.  Denies history of DVT/PE, recent prolonged immobilization, recent trauma or surgery, active cancer, or smoking.    Past Medical History:  Diagnosis Date  . Anxiety   . Arthritis   . Celiac syndrome   . History of inguinal hernia repair   . Thyroid disease    There are no active problems to display for this patient.  Past Surgical History:  Procedure Laterality Date  . APPENDECTOMY  1973  . COLONOSCOPY  2004, 2009, 2012  . HERNIA REPAIR  2002   RIH  . NEPHRECTOMY      OB History    No data available     Home Medications    Prior to Admission medications   Medication Sig Start Date End Date Taking? Authorizing Provider  Estradiol-Norethindrone Acet 0.5-0.1 MG per tablet  03/23/11  Yes Historical Provider, MD  SYNTHROID 100 MCG tablet daily. 02/11/11  Yes Historical Provider, MD  TRAMADOL HCL PO  Take by mouth.   Yes Historical Provider, MD  azithromycin (ZITHROMAX) 250 MG tablet Use as a zpack 12/05/14   Gwenlyn FoundJessica C Copland, MD  chlorpheniramine-HYDROcodone (TUSSIONEX PENNKINETIC ER) 10-8 MG/5ML SUER Take 5 mLs by mouth 2 (two) times daily. 11/30/14   Carmelina DaneJeffery S Anderson, MD  desonide (DESOWEN) 0.05 % ointment Ad lib. 02/01/11   Historical Provider, MD  diclofenac sodium (VOLTAREN) 1 % GEL Apply 2 g topically 4 (four) times daily. 03/18/16    C , PA-C  Emollient (HYLATOPIC PLUS) CREA Ad lib. 02/01/11   Historical Provider, MD  Estradiol (VAGIFEM) 10 MCG TABS vaginal tablet Place vaginally.    Historical Provider, MD  multivitamin [facility](THERAGRAN) per tablet Take 1 tablet by mouth daily.    Historical Provider, MD  Nutritional Supplements (PYCNOGENOL PO) Take 100 mg by mouth daily.    Historical Provider, MD    Family History Family History  Problem Relation Age of Onset  . Cancer Father     prostate  . Hypertension Father   . Diabetes Father   . Thyroid disease Mother   . Thyroid disease Sister   . Arthritis Maternal Grandmother   . Heart disease Maternal Grandmother   . Heart disease Maternal Grandfather   . Heart disease Paternal Grandmother   . Cancer Maternal Aunt     breast    Social History Social History  Substance Use Topics  . Smoking status: Never Smoker  .  Smokeless tobacco: Never Used  . Alcohol use Yes   Allergies   Penicillins   Review of Systems Review of Systems  Musculoskeletal: Positive for arthralgias and joint swelling.  Neurological: Negative for weakness and numbness.   Physical Exam Updated Vital Signs BP 124/55   Pulse 81   Temp 98.3 F (36.8 C) (Oral)   Resp 20   Ht 6' (1.829 m)   Wt 202 lb (91.6 kg)   SpO2 98%   BMI 27.40 kg/m   Physical Exam  Constitutional: She appears well-developed and well-nourished. No distress.  HENT:  Head: Normocephalic and atraumatic.  Eyes: Conjunctivae are normal.  Neck: Neck supple.   Cardiovascular: Normal rate, regular rhythm and intact distal pulses.   Pulmonary/Chest: Effort normal.  Musculoskeletal: She exhibits tenderness.  Tenderness and swelling over the left lateral malleolus, motor function with plantar and dorsal flexion of foot is intact. No erythema or increased warmth. No swelling noted to the calf.  Neurological: She is alert.  No sensory deficits. Strength 5/5 with dorsiflexion and plantar flexion.  Skin: Skin is warm and dry. Capillary refill takes less than 2 seconds. She is not diaphoretic.  Psychiatric: She has a normal mood and affect. Her behavior is normal.  Nursing note and vitals reviewed.  ED Treatments / Results  Labs (all labs ordered are listed, but only abnormal results are displayed) Labs Reviewed - No data to display  EKG  EKG Interpretation None       Radiology Dg Ankle Complete Left  Result Date: 03/18/2016 CLINICAL DATA:  Left ankle pain and swelling. EXAM: LEFT ANKLE COMPLETE - 3+ VIEW COMPARISON:  None. FINDINGS: There is no evidence of fracture, dislocation, or joint effusion. There is no evidence of arthropathy or other focal bone abnormality. Soft tissue swelling noted. IMPRESSION: Soft tissue swelling about the left ankle without evidence of fracture, dislocation or osteolytic changes. Electronically Signed   By: Ted Mcalpineobrinka  Dimitrova M.D.   On: 03/18/2016 20:36   Procedures Procedures (including critical care time)  Medications Ordered in ED Medications  acetaminophen (TYLENOL) tablet 1,000 mg (1,000 mg Oral Given 03/18/16 2114)    DIAGNOSTIC STUDIES:  Oxygen Saturation is 98% on RA, normal by my interpretation.   COORDINATION OF CARE:  8:31 PM Discussed treatment plan with pt at bedside and pt agreed to plan.  Initial Impression / Assessment and Plan / ED Course  I have reviewed the triage vital signs and the nursing notes.  Pertinent labs & imaging results that were available during my care of the patient  were reviewed by me and considered in my medical decision making (see chart for details).  Clinical Course     Patient presents with left ankle pain. No significant abnormalities on x-ray. Suspect inflammation due to overuse. PCP follow-up. Home care and return precautions discussed.     Final Clinical Impressions(s) / ED Diagnoses   Final diagnoses:  Acute left ankle pain    New Prescriptions Discharge Medication List as of 03/18/2016  8:45 PM    START taking these medications   Details  diclofenac sodium (VOLTAREN) 1 % GEL Apply 2 g topically 4 (four) times daily., Starting Sun 03/18/2016, Print         Anselm PancoastShawn C , PA-C 03/20/16 0018    Anselm PancoastShawn C , PA-C 03/20/16 0023    Nira ConnPedro Eduardo Cardama, MD 03/20/16 1153

## 2016-03-18 NOTE — Discharge Instructions (Signed)
There were no abnormalities on the x-ray. Use the diclofenac gel as needed for pain. Keep the extremity elevated whenever possible. Weightbearing as tolerated. Use the brace and the crutches for support and comfort. Follow up with a primary care provider for continued management of this issue. Return to the ED should symptoms worsen.

## 2016-03-20 DIAGNOSIS — M7672 Peroneal tendinitis, left leg: Secondary | ICD-10-CM | POA: Diagnosis not present

## 2016-03-20 DIAGNOSIS — M545 Low back pain: Secondary | ICD-10-CM | POA: Diagnosis not present

## 2016-03-20 DIAGNOSIS — M25572 Pain in left ankle and joints of left foot: Secondary | ICD-10-CM | POA: Diagnosis not present

## 2016-03-20 DIAGNOSIS — J0100 Acute maxillary sinusitis, unspecified: Secondary | ICD-10-CM | POA: Diagnosis not present

## 2016-03-26 HISTORY — PX: TOTAL HIP ARTHROPLASTY: SHX124

## 2016-03-27 DIAGNOSIS — M2560 Stiffness of unspecified joint, not elsewhere classified: Secondary | ICD-10-CM | POA: Diagnosis not present

## 2016-03-27 DIAGNOSIS — M545 Low back pain: Secondary | ICD-10-CM | POA: Diagnosis not present

## 2016-03-27 DIAGNOSIS — M5489 Other dorsalgia: Secondary | ICD-10-CM | POA: Diagnosis not present

## 2016-03-27 DIAGNOSIS — M6281 Muscle weakness (generalized): Secondary | ICD-10-CM | POA: Diagnosis not present

## 2016-03-30 DIAGNOSIS — M5489 Other dorsalgia: Secondary | ICD-10-CM | POA: Diagnosis not present

## 2016-03-30 DIAGNOSIS — M6281 Muscle weakness (generalized): Secondary | ICD-10-CM | POA: Diagnosis not present

## 2016-03-30 DIAGNOSIS — M545 Low back pain: Secondary | ICD-10-CM | POA: Diagnosis not present

## 2016-03-30 DIAGNOSIS — M2560 Stiffness of unspecified joint, not elsewhere classified: Secondary | ICD-10-CM | POA: Diagnosis not present

## 2016-04-03 DIAGNOSIS — M2560 Stiffness of unspecified joint, not elsewhere classified: Secondary | ICD-10-CM | POA: Diagnosis not present

## 2016-04-03 DIAGNOSIS — M5489 Other dorsalgia: Secondary | ICD-10-CM | POA: Diagnosis not present

## 2016-04-03 DIAGNOSIS — M545 Low back pain: Secondary | ICD-10-CM | POA: Diagnosis not present

## 2016-04-03 DIAGNOSIS — M6281 Muscle weakness (generalized): Secondary | ICD-10-CM | POA: Diagnosis not present

## 2016-04-05 DIAGNOSIS — Z01419 Encounter for gynecological examination (general) (routine) without abnormal findings: Secondary | ICD-10-CM | POA: Diagnosis not present

## 2016-04-05 DIAGNOSIS — Z6827 Body mass index (BMI) 27.0-27.9, adult: Secondary | ICD-10-CM | POA: Diagnosis not present

## 2016-04-06 DIAGNOSIS — M6281 Muscle weakness (generalized): Secondary | ICD-10-CM | POA: Diagnosis not present

## 2016-04-06 DIAGNOSIS — M5489 Other dorsalgia: Secondary | ICD-10-CM | POA: Diagnosis not present

## 2016-04-06 DIAGNOSIS — M545 Low back pain: Secondary | ICD-10-CM | POA: Diagnosis not present

## 2016-04-06 DIAGNOSIS — M2560 Stiffness of unspecified joint, not elsewhere classified: Secondary | ICD-10-CM | POA: Diagnosis not present

## 2016-04-09 DIAGNOSIS — M545 Low back pain: Secondary | ICD-10-CM | POA: Diagnosis not present

## 2016-04-09 DIAGNOSIS — M5489 Other dorsalgia: Secondary | ICD-10-CM | POA: Diagnosis not present

## 2016-04-09 DIAGNOSIS — M2560 Stiffness of unspecified joint, not elsewhere classified: Secondary | ICD-10-CM | POA: Diagnosis not present

## 2016-04-09 DIAGNOSIS — M6281 Muscle weakness (generalized): Secondary | ICD-10-CM | POA: Diagnosis not present

## 2016-04-19 DIAGNOSIS — M6281 Muscle weakness (generalized): Secondary | ICD-10-CM | POA: Diagnosis not present

## 2016-04-19 DIAGNOSIS — M2560 Stiffness of unspecified joint, not elsewhere classified: Secondary | ICD-10-CM | POA: Diagnosis not present

## 2016-04-19 DIAGNOSIS — M545 Low back pain: Secondary | ICD-10-CM | POA: Diagnosis not present

## 2016-04-19 DIAGNOSIS — M5489 Other dorsalgia: Secondary | ICD-10-CM | POA: Diagnosis not present

## 2016-04-26 DIAGNOSIS — R7301 Impaired fasting glucose: Secondary | ICD-10-CM | POA: Diagnosis not present

## 2016-04-26 DIAGNOSIS — E038 Other specified hypothyroidism: Secondary | ICD-10-CM | POA: Diagnosis not present

## 2016-04-26 DIAGNOSIS — R8299 Other abnormal findings in urine: Secondary | ICD-10-CM | POA: Diagnosis not present

## 2016-04-26 DIAGNOSIS — Z1231 Encounter for screening mammogram for malignant neoplasm of breast: Secondary | ICD-10-CM | POA: Diagnosis not present

## 2016-04-26 DIAGNOSIS — Z0000 Encounter for general adult medical examination without abnormal findings: Secondary | ICD-10-CM | POA: Diagnosis not present

## 2016-05-03 DIAGNOSIS — M25552 Pain in left hip: Secondary | ICD-10-CM | POA: Diagnosis not present

## 2016-05-03 DIAGNOSIS — M545 Low back pain: Secondary | ICD-10-CM | POA: Diagnosis not present

## 2016-05-03 DIAGNOSIS — Z524 Kidney donor: Secondary | ICD-10-CM | POA: Diagnosis not present

## 2016-05-03 DIAGNOSIS — R7301 Impaired fasting glucose: Secondary | ICD-10-CM | POA: Diagnosis not present

## 2016-05-03 DIAGNOSIS — Z1389 Encounter for screening for other disorder: Secondary | ICD-10-CM | POA: Diagnosis not present

## 2016-05-03 DIAGNOSIS — Z905 Acquired absence of kidney: Secondary | ICD-10-CM | POA: Diagnosis not present

## 2016-05-03 DIAGNOSIS — Z0000 Encounter for general adult medical examination without abnormal findings: Secondary | ICD-10-CM | POA: Diagnosis not present

## 2016-05-07 DIAGNOSIS — Z1212 Encounter for screening for malignant neoplasm of rectum: Secondary | ICD-10-CM | POA: Diagnosis not present

## 2016-06-07 DIAGNOSIS — M545 Low back pain: Secondary | ICD-10-CM | POA: Diagnosis not present

## 2016-06-07 DIAGNOSIS — M25552 Pain in left hip: Secondary | ICD-10-CM | POA: Diagnosis not present

## 2016-06-07 DIAGNOSIS — Z01818 Encounter for other preprocedural examination: Secondary | ICD-10-CM | POA: Diagnosis not present

## 2016-06-25 DIAGNOSIS — J0100 Acute maxillary sinusitis, unspecified: Secondary | ICD-10-CM | POA: Diagnosis not present

## 2016-06-25 DIAGNOSIS — M25552 Pain in left hip: Secondary | ICD-10-CM | POA: Diagnosis not present

## 2016-06-25 DIAGNOSIS — R05 Cough: Secondary | ICD-10-CM | POA: Diagnosis not present

## 2016-06-25 DIAGNOSIS — Z6828 Body mass index (BMI) 28.0-28.9, adult: Secondary | ICD-10-CM | POA: Diagnosis not present

## 2016-10-04 DIAGNOSIS — Z808 Family history of malignant neoplasm of other organs or systems: Secondary | ICD-10-CM | POA: Diagnosis not present

## 2016-10-04 DIAGNOSIS — D225 Melanocytic nevi of trunk: Secondary | ICD-10-CM | POA: Diagnosis not present

## 2016-10-04 DIAGNOSIS — D2372 Other benign neoplasm of skin of left lower limb, including hip: Secondary | ICD-10-CM | POA: Diagnosis not present

## 2016-10-04 DIAGNOSIS — D1800 Hemangioma unspecified site: Secondary | ICD-10-CM | POA: Diagnosis not present

## 2016-10-18 DIAGNOSIS — M9904 Segmental and somatic dysfunction of sacral region: Secondary | ICD-10-CM | POA: Diagnosis not present

## 2016-10-18 DIAGNOSIS — M9901 Segmental and somatic dysfunction of cervical region: Secondary | ICD-10-CM | POA: Diagnosis not present

## 2016-10-18 DIAGNOSIS — M25551 Pain in right hip: Secondary | ICD-10-CM | POA: Diagnosis not present

## 2016-10-18 DIAGNOSIS — M609 Myositis, unspecified: Secondary | ICD-10-CM | POA: Diagnosis not present

## 2016-11-15 DIAGNOSIS — M25572 Pain in left ankle and joints of left foot: Secondary | ICD-10-CM | POA: Diagnosis not present

## 2016-11-22 DIAGNOSIS — M9901 Segmental and somatic dysfunction of cervical region: Secondary | ICD-10-CM | POA: Diagnosis not present

## 2016-11-22 DIAGNOSIS — M9904 Segmental and somatic dysfunction of sacral region: Secondary | ICD-10-CM | POA: Diagnosis not present

## 2016-11-22 DIAGNOSIS — M609 Myositis, unspecified: Secondary | ICD-10-CM | POA: Diagnosis not present

## 2016-11-22 DIAGNOSIS — M25551 Pain in right hip: Secondary | ICD-10-CM | POA: Diagnosis not present

## 2016-11-27 DIAGNOSIS — M609 Myositis, unspecified: Secondary | ICD-10-CM | POA: Diagnosis not present

## 2016-11-27 DIAGNOSIS — M9904 Segmental and somatic dysfunction of sacral region: Secondary | ICD-10-CM | POA: Diagnosis not present

## 2016-11-27 DIAGNOSIS — M9901 Segmental and somatic dysfunction of cervical region: Secondary | ICD-10-CM | POA: Diagnosis not present

## 2016-11-27 DIAGNOSIS — M25551 Pain in right hip: Secondary | ICD-10-CM | POA: Diagnosis not present

## 2016-12-06 DIAGNOSIS — M9904 Segmental and somatic dysfunction of sacral region: Secondary | ICD-10-CM | POA: Diagnosis not present

## 2016-12-06 DIAGNOSIS — M609 Myositis, unspecified: Secondary | ICD-10-CM | POA: Diagnosis not present

## 2016-12-06 DIAGNOSIS — M25551 Pain in right hip: Secondary | ICD-10-CM | POA: Diagnosis not present

## 2016-12-06 DIAGNOSIS — M9901 Segmental and somatic dysfunction of cervical region: Secondary | ICD-10-CM | POA: Diagnosis not present

## 2016-12-13 DIAGNOSIS — M9904 Segmental and somatic dysfunction of sacral region: Secondary | ICD-10-CM | POA: Diagnosis not present

## 2016-12-13 DIAGNOSIS — M25551 Pain in right hip: Secondary | ICD-10-CM | POA: Diagnosis not present

## 2016-12-13 DIAGNOSIS — M9901 Segmental and somatic dysfunction of cervical region: Secondary | ICD-10-CM | POA: Diagnosis not present

## 2016-12-13 DIAGNOSIS — M609 Myositis, unspecified: Secondary | ICD-10-CM | POA: Diagnosis not present

## 2016-12-20 DIAGNOSIS — M9901 Segmental and somatic dysfunction of cervical region: Secondary | ICD-10-CM | POA: Diagnosis not present

## 2016-12-20 DIAGNOSIS — M609 Myositis, unspecified: Secondary | ICD-10-CM | POA: Diagnosis not present

## 2016-12-20 DIAGNOSIS — M9904 Segmental and somatic dysfunction of sacral region: Secondary | ICD-10-CM | POA: Diagnosis not present

## 2016-12-20 DIAGNOSIS — M25551 Pain in right hip: Secondary | ICD-10-CM | POA: Diagnosis not present

## 2016-12-27 DIAGNOSIS — M609 Myositis, unspecified: Secondary | ICD-10-CM | POA: Diagnosis not present

## 2016-12-27 DIAGNOSIS — M9904 Segmental and somatic dysfunction of sacral region: Secondary | ICD-10-CM | POA: Diagnosis not present

## 2016-12-27 DIAGNOSIS — M9901 Segmental and somatic dysfunction of cervical region: Secondary | ICD-10-CM | POA: Diagnosis not present

## 2016-12-27 DIAGNOSIS — M25551 Pain in right hip: Secondary | ICD-10-CM | POA: Diagnosis not present

## 2017-01-10 DIAGNOSIS — M9904 Segmental and somatic dysfunction of sacral region: Secondary | ICD-10-CM | POA: Diagnosis not present

## 2017-01-10 DIAGNOSIS — M25551 Pain in right hip: Secondary | ICD-10-CM | POA: Diagnosis not present

## 2017-01-10 DIAGNOSIS — M9901 Segmental and somatic dysfunction of cervical region: Secondary | ICD-10-CM | POA: Diagnosis not present

## 2017-01-10 DIAGNOSIS — M609 Myositis, unspecified: Secondary | ICD-10-CM | POA: Diagnosis not present

## 2017-01-17 DIAGNOSIS — M9904 Segmental and somatic dysfunction of sacral region: Secondary | ICD-10-CM | POA: Diagnosis not present

## 2017-01-17 DIAGNOSIS — M9901 Segmental and somatic dysfunction of cervical region: Secondary | ICD-10-CM | POA: Diagnosis not present

## 2017-01-17 DIAGNOSIS — M25551 Pain in right hip: Secondary | ICD-10-CM | POA: Diagnosis not present

## 2017-01-17 DIAGNOSIS — M609 Myositis, unspecified: Secondary | ICD-10-CM | POA: Diagnosis not present

## 2017-01-24 DIAGNOSIS — M609 Myositis, unspecified: Secondary | ICD-10-CM | POA: Diagnosis not present

## 2017-01-24 DIAGNOSIS — M25551 Pain in right hip: Secondary | ICD-10-CM | POA: Diagnosis not present

## 2017-01-24 DIAGNOSIS — M9904 Segmental and somatic dysfunction of sacral region: Secondary | ICD-10-CM | POA: Diagnosis not present

## 2017-01-24 DIAGNOSIS — M9901 Segmental and somatic dysfunction of cervical region: Secondary | ICD-10-CM | POA: Diagnosis not present

## 2017-02-07 DIAGNOSIS — M9901 Segmental and somatic dysfunction of cervical region: Secondary | ICD-10-CM | POA: Diagnosis not present

## 2017-02-07 DIAGNOSIS — M9904 Segmental and somatic dysfunction of sacral region: Secondary | ICD-10-CM | POA: Diagnosis not present

## 2017-02-07 DIAGNOSIS — M609 Myositis, unspecified: Secondary | ICD-10-CM | POA: Diagnosis not present

## 2017-02-07 DIAGNOSIS — M25551 Pain in right hip: Secondary | ICD-10-CM | POA: Diagnosis not present

## 2017-03-12 DIAGNOSIS — M25551 Pain in right hip: Secondary | ICD-10-CM | POA: Diagnosis not present

## 2017-03-12 DIAGNOSIS — M9904 Segmental and somatic dysfunction of sacral region: Secondary | ICD-10-CM | POA: Diagnosis not present

## 2017-03-12 DIAGNOSIS — M9901 Segmental and somatic dysfunction of cervical region: Secondary | ICD-10-CM | POA: Diagnosis not present

## 2017-03-12 DIAGNOSIS — M609 Myositis, unspecified: Secondary | ICD-10-CM | POA: Diagnosis not present

## 2017-03-28 ENCOUNTER — Ambulatory Visit: Payer: BLUE CROSS/BLUE SHIELD | Admitting: Physician Assistant

## 2017-03-28 ENCOUNTER — Encounter: Payer: Self-pay | Admitting: Physician Assistant

## 2017-03-28 VITALS — BP 114/70 | HR 80 | Temp 99.3°F | Resp 16 | Ht 72.0 in | Wt 208.0 lb

## 2017-03-28 DIAGNOSIS — J329 Chronic sinusitis, unspecified: Secondary | ICD-10-CM | POA: Diagnosis not present

## 2017-03-28 DIAGNOSIS — J310 Chronic rhinitis: Secondary | ICD-10-CM

## 2017-03-28 MED ORDER — DOXYCYCLINE HYCLATE 100 MG PO CAPS: 100.0000 mg | ORAL_CAPSULE | Freq: Two times a day (BID) | ORAL | 0 refills | Status: AC

## 2017-03-28 MED ORDER — IPRATROPIUM BROMIDE 0.03 % NA SOLN: 2.0000 | Freq: Two times a day (BID) | NASAL | 0 refills | Status: DC

## 2017-03-28 NOTE — Patient Instructions (Addendum)
Please continue to hydrate well with 64 of water if not more. I would like you to try the mucinex, and nasal spray.  If your symptoms do not improve within 24-48 hours, go ahead and fill the doxycycline and take to completion.   Sinusitis, Adult Sinusitis is soreness and inflammation of your sinuses. Sinuses are hollow spaces in the bones around your face. They are located:  Around your eyes.  In the middle of your forehead.  Behind your nose.  In your cheekbones.  Your sinuses and nasal passages are lined with a stringy fluid (mucus). Mucus normally drains out of your sinuses. When your nasal tissues get inflamed or swollen, the mucus can get trapped or blocked so air cannot flow through your sinuses. This lets bacteria, viruses, and funguses grow, and that leads to infection. Follow these instructions at home: Medicines  Take, use, or apply over-the-counter and prescription medicines only as told by your doctor. These may include nasal sprays.  If you were prescribed an antibiotic medicine, take it as told by your doctor. Do not stop taking the antibiotic even if you start to feel better. Hydrate and Humidify  Drink enough water to keep your pee (urine) clear or pale yellow.  Use a cool mist humidifier to keep the humidity level in your home above 50%.  Breathe in steam for 10-15 minutes, 3-4 times a day or as told by your doctor. You can do this in the bathroom while a hot shower is running.  Try not to spend time in cool or dry air. Rest  Rest as much as possible.  Sleep with your head raised (elevated).  Make sure to get enough sleep each night. General instructions  Put a warm, moist washcloth on your face 3-4 times a day or as told by your doctor. This will help with discomfort.  Wash your hands often with soap and water. If there is no soap and water, use hand sanitizer.  Do not smoke. Avoid being around people who are smoking (secondhand smoke).  Keep all  follow-up visits as told by your doctor. This is important. Contact a doctor if:  You have a fever.  Your symptoms get worse.  Your symptoms do not get better within 10 days. Get help right away if:  You have a very bad headache.  You cannot stop throwing up (vomiting).  You have pain or swelling around your face or eyes.  You have trouble seeing.  You feel confused.  Your neck is stiff.  You have trouble breathing. This information is not intended to replace advice given to you by your health care provider. Make sure you discuss any questions you have with your health care provider. Document Released: 08/29/2007 Document Revised: 11/06/2015 Document Reviewed: 01/05/2015 Elsevier Interactive Patient Education  2018 [employer]    IF you received an x-ray today, you will receive an invoice from [facility]. Please contact [facility] at [phone] with questions or concerns regarding your invoice.   IF you received labwork today, you will receive an invoice from [facility]. Please contact [facility] at [phone] with questions or concerns regarding your invoice.   Our billing staff will not be able to assist you with questions regarding bills from these companies.  You will be contacted with the lab results as soon as they are available. The fastest way to get your results is to activate your My Chart account. Instructions are located on the last page of this paperwork. If you have not  heard from [place] regarding the results in 2 weeks, please contact this office.

## 2017-04-04 ENCOUNTER — Other Ambulatory Visit: Payer: Self-pay

## 2017-04-04 ENCOUNTER — Ambulatory Visit: Payer: BLUE CROSS/BLUE SHIELD | Admitting: Physician Assistant

## 2017-04-04 VITALS — BP 116/62 | HR 65 | Temp 98.8°F | Resp 16 | Ht 72.0 in | Wt 208.0 lb

## 2017-04-04 DIAGNOSIS — J029 Acute pharyngitis, unspecified: Secondary | ICD-10-CM

## 2017-04-04 LAB — POCT RAPID STREP A (OFFICE): Rapid Strep A Screen: NEGATIVE

## 2017-04-04 MED ORDER — CLINDAMYCIN HCL 300 MG PO CAPS: 300.0000 mg | ORAL_CAPSULE | Freq: Three times a day (TID) | ORAL | 0 refills | Status: DC

## 2017-04-04 NOTE — Progress Notes (Signed)
[facility] [address], [place] [address] [phone]  Date:  04/04/2017   Name:  Regina Reyes   DOB:  14-Apr-1955   MRN:  [id number]  PCP:  Gaspar Garbeisovec, Richard W, MD    History of Present Illness:  Regina Reyes is a 62 y.o. female patient who presents to PCP with  Chief Complaint  Patient presents with  . Sore Throat    x 3 days     3 days of sore throat.  Hurts in the back of her throat to swallow.  Congestion has resolved.  She is concerned that she cleared her symptoms from 1 week ago.  Strep exposure from several employees at her job. Recently taking doxycycline.  Will finish tomorrow. No fever but over the last 2 days has felt fatigue and malaise.  No sob or dyspnea.  Does not feel like her throat is closing.  There are no active problems to display for this patient.   Past Medical History:  Diagnosis Date  . Anxiety   . Arthritis   . Celiac syndrome   . History of inguinal hernia repair   . Thyroid disease     Past Surgical History:  Procedure Laterality Date  . APPENDECTOMY  1973  . COLONOSCOPY  2004, 2009, 2012  . HERNIA REPAIR  2002   RIH  . NEPHRECTOMY      Social History   Tobacco Use  . Smoking status: Never Smoker  . Smokeless tobacco: Never Used  Substance Use Topics  . Alcohol use: Yes  . Drug use: No    Family History  Problem Relation Age of Onset  . Cancer Father        prostate  . Hypertension Father   . Diabetes Father   . Thyroid disease Mother   . Thyroid disease Sister   . Arthritis Maternal Grandmother   . Heart disease Maternal Grandmother   . Heart disease Maternal Grandfather   . Heart disease Paternal Grandmother   . Cancer Maternal Aunt        breast    Allergies  Allergen Reactions  . Penicillins     Medication list has been reviewed and updated.  Current Outpatient Medications on File Prior to Visit  Medication Sig Dispense Refill  . desonide (DESOWEN) 0.05 % ointment Ad lib.    Marland Kitchen. diclofenac sodium  (VOLTAREN) 1 % GEL Apply 2 g topically 4 (four) times daily. 100 g 0  . doxycycline (VIBRAMYCIN) 100 MG capsule Take 1 capsule (100 mg total) by mouth 2 (two) times daily for 7 days. 14 capsule 0  . Emollient (HYLATOPIC PLUS) CREA Ad lib.    . Estradiol (VAGIFEM) 10 MCG TABS vaginal tablet Place vaginally.    . Estradiol-Norethindrone Acet 0.5-0.1 MG per tablet     . ipratropium (ATROVENT) 0.03 % nasal spray Place 2 sprays into both nostrils 2 (two) times daily. 30 mL 0  . multivitamin (THERAGRAN) per tablet Take 1 tablet by mouth daily.    . Nutritional Supplements (PYCNOGENOL PO) Take 100 mg by mouth daily.    Marland Kitchen. SYNTHROID 100 MCG tablet daily.    . TRAMADOL HCL PO Take by mouth.    Marland Kitchen. azithromycin (ZITHROMAX) 250 MG tablet Use as a zpack (Patient not taking: Reported on 03/28/2017) 6 each 0  . chlorpheniramine-HYDROcodone (TUSSIONEX PENNKINETIC ER) 10-8 MG/5ML SUER Take 5 mLs by mouth 2 (two) times daily. (Patient not taking: Reported on 03/28/2017) 60 mL 0   No  current facility-administered medications on file prior to visit.     ROS ROS otherwise unremarkable unless listed above.  Physical Examination: BP 116/62   Pulse 65   Temp 98.8 F (37.1 C) (Oral)   Resp 16   Ht 6' (1.829 m)   Wt 208 lb (94.3 kg)   SpO2 97%   BMI 28.21 kg/m  Ideal Body Weight: Weight in (lb) to have BMI = 25: 183.9  Physical Exam  Constitutional: She is oriented to person, place, and time. She appears well-developed and well-nourished. No distress.  HENT:  Head: Normocephalic and atraumatic.  Right Ear: Tympanic membrane, external ear and ear canal normal.  Left Ear: Tympanic membrane, external ear and ear canal normal.  Nose: No mucosal edema or rhinorrhea. Right sinus exhibits no maxillary sinus tenderness and no frontal sinus tenderness. Left sinus exhibits no maxillary sinus tenderness and no frontal sinus tenderness.  Mouth/Throat: No uvula swelling. Posterior oropharyngeal erythema (mild) present. No  oropharyngeal exudate or posterior oropharyngeal edema.  Eyes: Conjunctivae and EOM are normal. Pupils are equal, round, and reactive to light.  Cardiovascular: Normal rate and regular rhythm. Exam reveals no gallop, no distant heart sounds and no friction rub.  No murmur heard. Pulmonary/Chest: Effort normal. No respiratory distress. She has no decreased breath sounds. She has no wheezes. She has no rhonchi.  Lymphadenopathy:       Head (right side): No submandibular, no tonsillar, no preauricular and no posterior auricular adenopathy present.       Head (left side): No submandibular, no tonsillar, no preauricular and no posterior auricular adenopathy present.  Neurological: She is alert and oriented to person, place, and time.  Skin: She is not diaphoretic.  Psychiatric: She has a normal mood and affect. Her behavior is normal.     Assessment and Plan: Regina Reyes is a 62 y.o. female who is here today for cc of  Chief Complaint  Patient presents with  . Sore Throat    x 3 days   Acute pharyngitis, unspecified etiology  Sore throat - Plan: POCT rapid strep A, clindamycin (CLEOCIN) 300 MG capsule, Culture, Group A Strep  Trena Platt, PA-C [facility] 1/13/20196:35 PM

## 2017-04-04 NOTE — Patient Instructions (Addendum)
Please take antibiotics to completion  Pharyngitis Pharyngitis is a sore throat (pharynx). There is redness, pain, and swelling of your throat. Follow these instructions at home:  Drink enough fluids to keep your pee (urine) clear or pale yellow.  Only take medicine as told by your doctor. ? You may get sick again if you do not take medicine as told. Finish your medicines, even if you start to feel better. ? Do not take aspirin.  Rest.  Rinse your mouth (gargle) with salt water ( tsp of salt per 1 qt of water) every 1-2 hours. This will help the pain.  If you are not at risk for choking, you can suck on hard candy or sore throat lozenges. Contact a doctor if:  You have large, tender lumps on your neck.  You have a rash.  You cough up green, yellow-brown, or bloody spit. Get help right away if:  You have a stiff neck.  You drool or cannot swallow liquids.  You throw up (vomit) or are not able to keep medicine or liquids down.  You have very bad pain that does not go away with medicine.  You have problems breathing (not from a stuffy nose). This information is not intended to replace advice given to you by your health care provider. Make sure you discuss any questions you have with your health care provider. Document Released: 08/29/2007 Document Revised: 08/18/2015 Document Reviewed: 11/17/2012 Elsevier Interactive Patient Education  2017 [employer]

## 2017-04-07 ENCOUNTER — Encounter: Payer: Self-pay | Admitting: Physician Assistant

## 2017-04-07 LAB — CULTURE, GROUP A STREP: Strep A Culture: NEGATIVE

## 2017-04-09 ENCOUNTER — Encounter: Payer: Self-pay | Admitting: Radiology

## 2017-04-09 ENCOUNTER — Encounter: Payer: Self-pay | Admitting: Physician Assistant

## 2017-04-09 IMAGING — CR DG ANKLE COMPLETE 3+V*L*
3 series · 3 of 3 positions shown · non-contrast
Comparison: None.

CLINICAL DATA: Left ankle pain and swelling.

EXAM:
LEFT ANKLE COMPLETE - 3+ VIEW

[t ankle joint ap left]
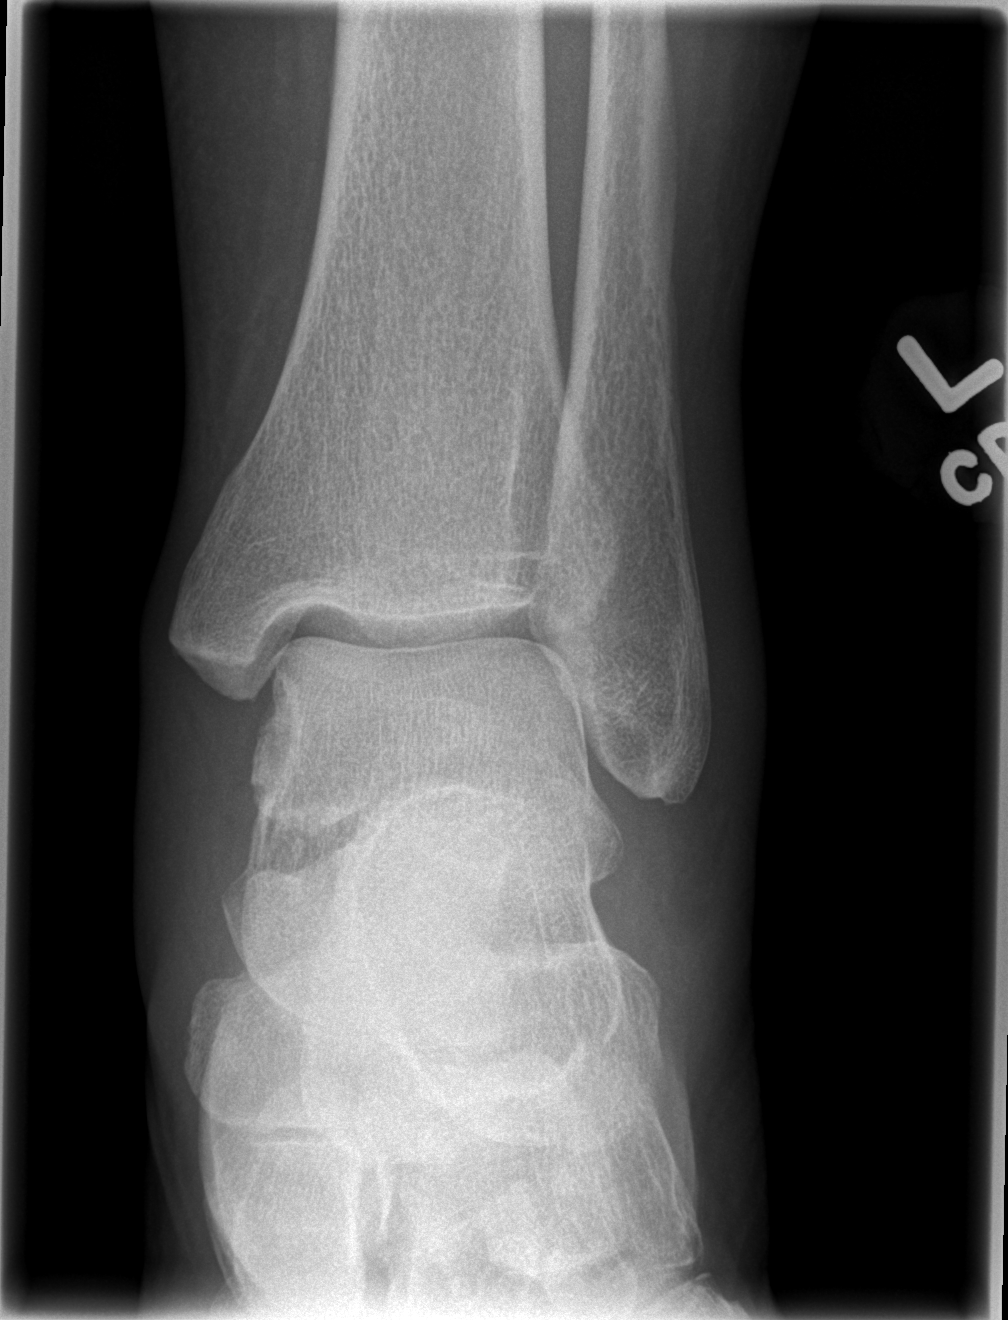

[t ankle joint oblique left]
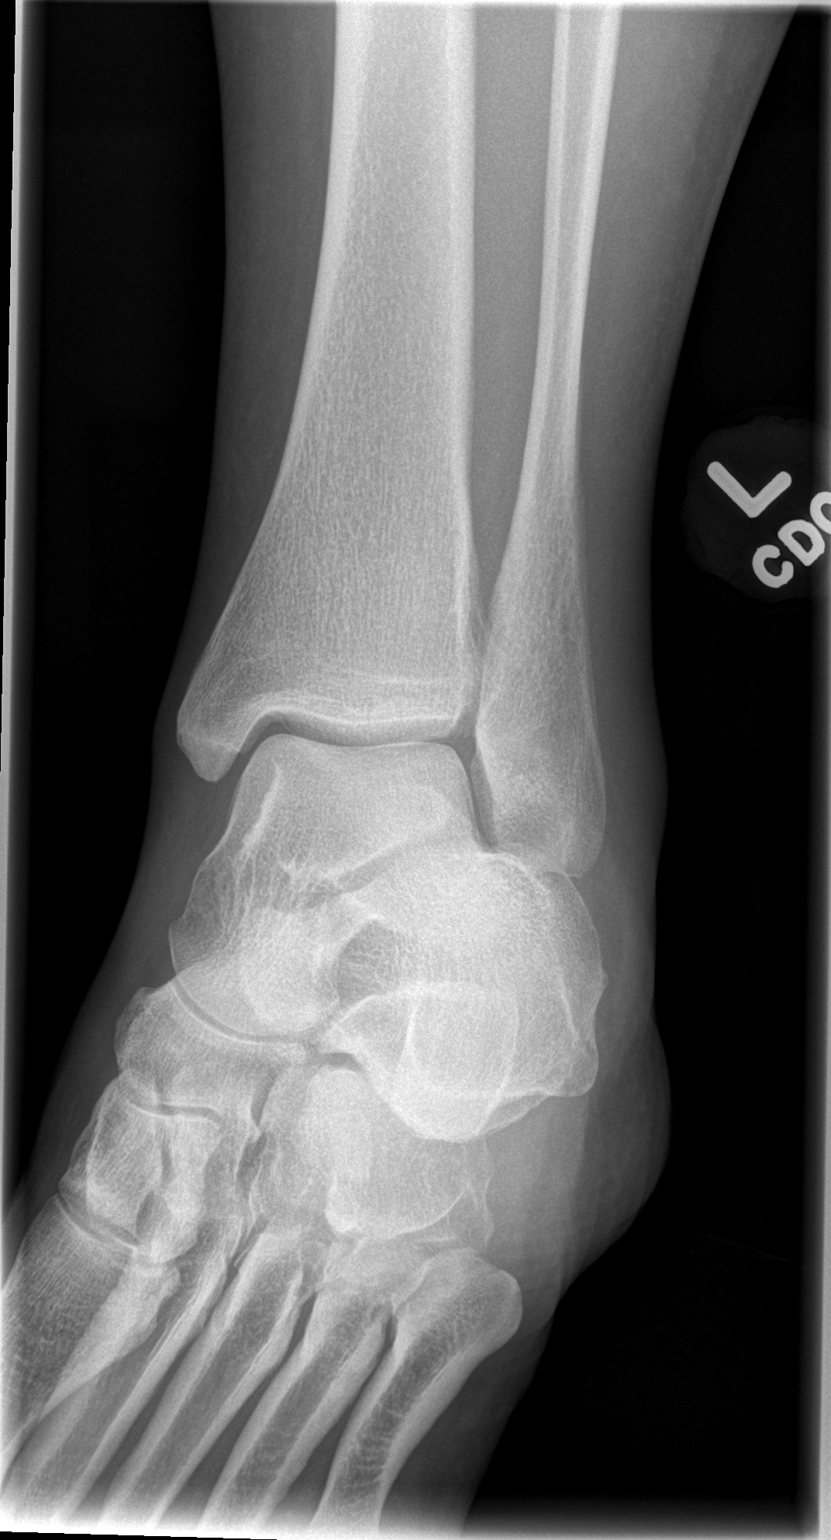

[t ankle joint lat left]
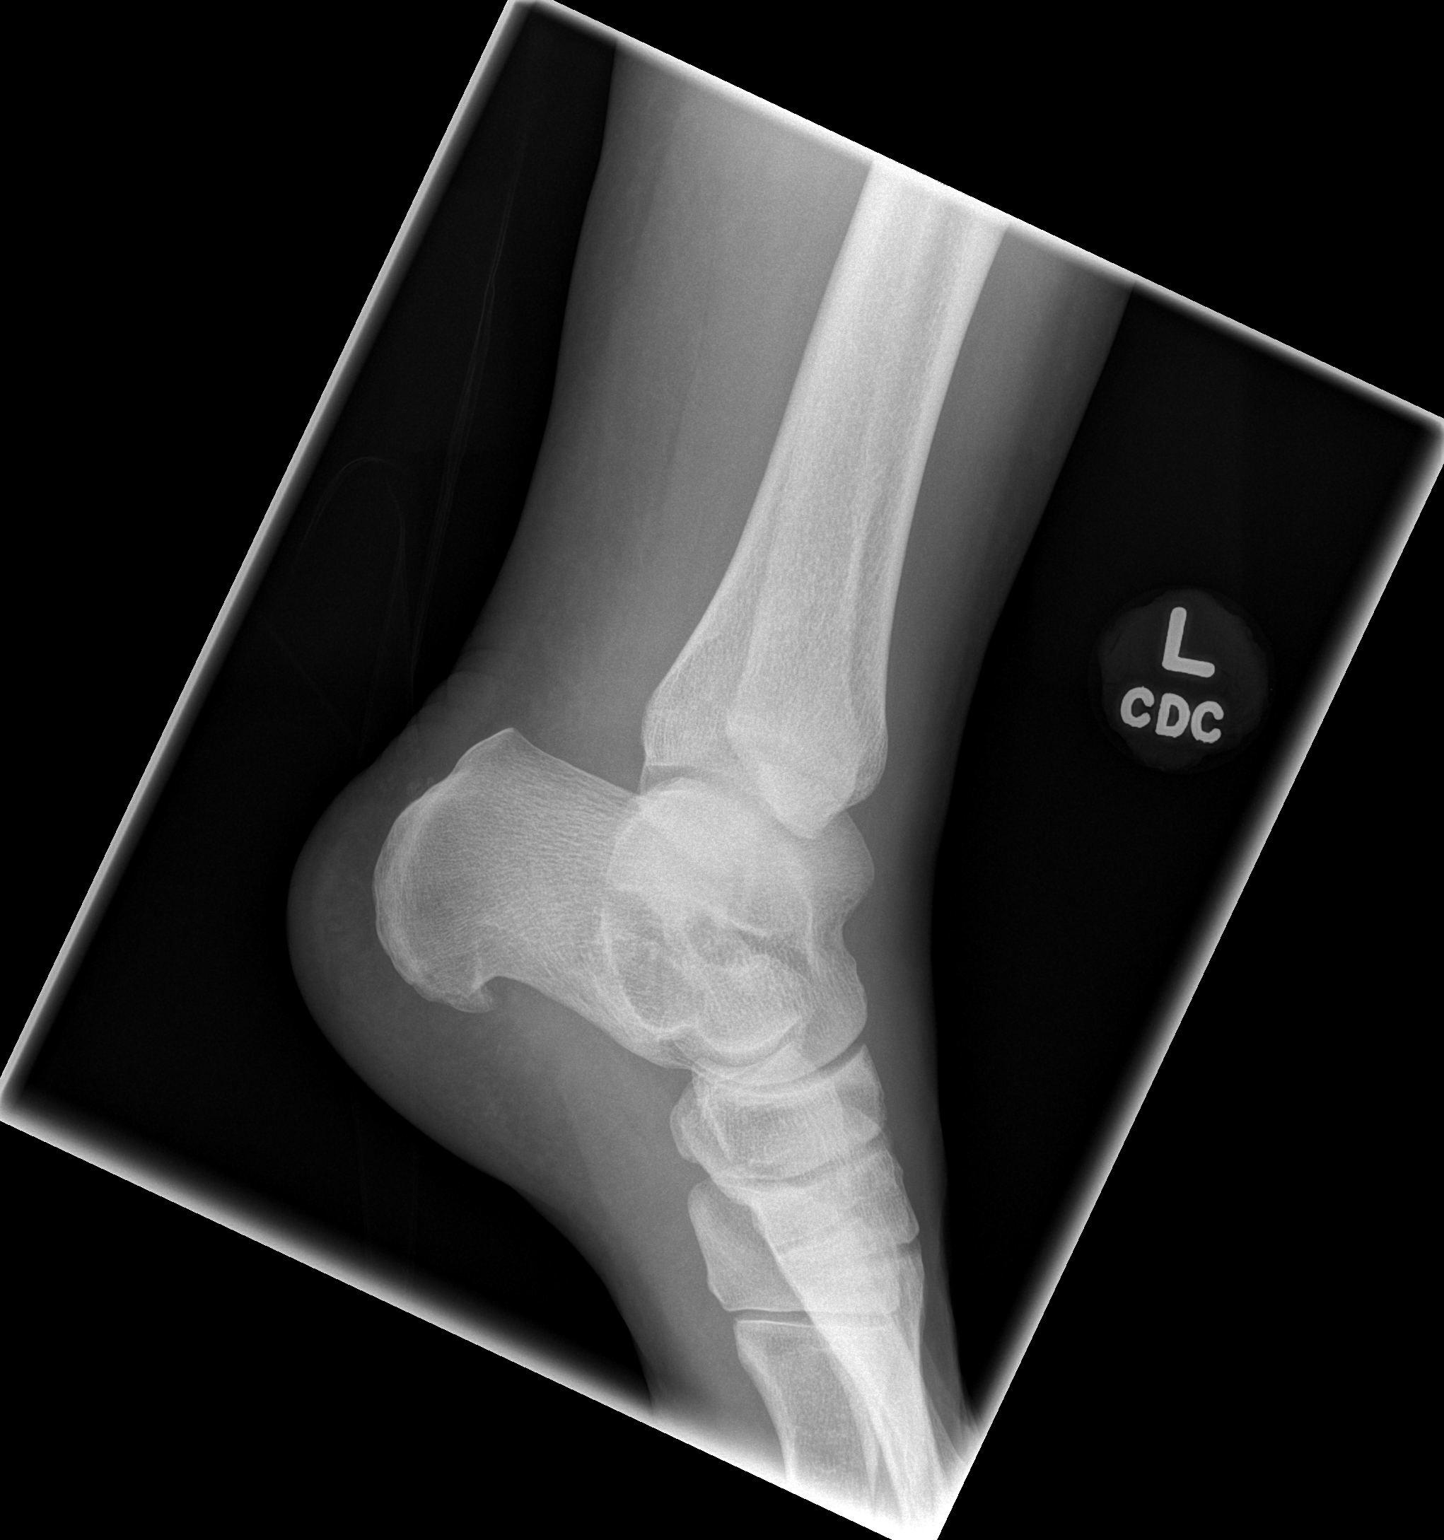

[3 of 3 positions shown; findings below may reference images not displayed]

FINDINGS: There is no evidence of fracture, dislocation, or joint effusion.
There is no evidence of arthropathy or other focal bone abnormality.
Soft tissue swelling noted.
IMPRESSION: Soft tissue swelling about the left ankle without evidence of
fracture, dislocation or osteolytic changes.

## 2017-04-09 NOTE — Progress Notes (Signed)
[facility] [address], [place] [address] [phone]  Date:  03/28/2017   Name:  Regina Reyes   DOB:  May 24, 1955   MRN:  [id number]  PCP:  Regina Garbeisovec, Regina W, MD    History of Present Illness:  Regina Reyes is a 62 y.o. female patient who presents to PCP with  Chief Complaint  Patient presents with  . Sinusitis    yellow-green mucus/ x 1o days  . Cough    x 10 days     10 days of sinus congestion, some runny nose.  Mucus is thick and yellow-green mucus.   Cough without any sob or dyspnea No fever. Some ear discomfort.  There are no active problems to display for this patient.   Past Medical History:  Diagnosis Date  . Anxiety   . Arthritis   . Celiac syndrome   . History of inguinal hernia repair   . Thyroid disease     Past Surgical History:  Procedure Laterality Date  . APPENDECTOMY  1973  . COLONOSCOPY  2004, 2009, 2012  . HERNIA REPAIR  2002   RIH  . NEPHRECTOMY      Social History   Tobacco Use  . Smoking status: Never Smoker  . Smokeless tobacco: Never Used  Substance Use Topics  . Alcohol use: Yes  . Drug use: No    Family History  Problem Relation Age of Onset  . Cancer Father        prostate  . Hypertension Father   . Diabetes Father   . Thyroid disease Mother   . Thyroid disease Sister   . Arthritis Maternal Grandmother   . Heart disease Maternal Grandmother   . Heart disease Maternal Grandfather   . Heart disease Paternal Grandmother   . Cancer Maternal Aunt        breast    Allergies  Allergen Reactions  . Penicillins     Medication list has been reviewed and updated.  Current Outpatient Medications on File Prior to Visit  Medication Sig Dispense Refill  . desonide (DESOWEN) 0.05 % ointment Ad lib.    Marland Kitchen. diclofenac sodium (VOLTAREN) 1 % GEL Apply 2 g topically 4 (four) times daily. 100 g 0  . Emollient (HYLATOPIC PLUS) CREA Ad lib.    . Estradiol (VAGIFEM) 10 MCG TABS vaginal tablet Place vaginally.    .  Estradiol-Norethindrone Acet 0.5-0.1 MG per tablet     . multivitamin (THERAGRAN) per tablet Take 1 tablet by mouth daily.    . Nutritional Supplements (PYCNOGENOL PO) Take 100 mg by mouth daily.    Marland Kitchen. SYNTHROID 100 MCG tablet daily.    . TRAMADOL HCL PO Take by mouth.    Marland Kitchen. azithromycin (ZITHROMAX) 250 MG tablet Use as a zpack (Patient not taking: Reported on 03/28/2017) 6 each 0  . chlorpheniramine-HYDROcodone (TUSSIONEX PENNKINETIC ER) 10-8 MG/5ML SUER Take 5 mLs by mouth 2 (two) times daily. (Patient not taking: Reported on 03/28/2017) 60 mL 0   No current facility-administered medications on file prior to visit.     ROS ROS otherwise unremarkable unless listed above.  Physical Examination: BP 114/70   Pulse 80   Temp 99.3 F (37.4 C) (Oral)   Resp 16   Ht 6' (1.829 m)   Wt 208 lb (94.3 kg)   SpO2 95%   BMI 28.21 kg/m  Ideal Body Weight: Weight in (lb) to have BMI = 25: 183.9  Physical Exam  Constitutional: She is oriented  to person, place, and time. She appears well-developed and well-nourished. No distress.  HENT:  Head: Normocephalic and atraumatic.  Right Ear: Tympanic membrane, external ear and ear canal normal.  Left Ear: Tympanic membrane, external ear and ear canal normal.  Nose: Mucosal edema and rhinorrhea present. Right sinus exhibits no maxillary sinus tenderness and no frontal sinus tenderness. Left sinus exhibits no maxillary sinus tenderness and no frontal sinus tenderness.  Mouth/Throat: No uvula swelling. No oropharyngeal exudate, posterior oropharyngeal edema or posterior oropharyngeal erythema.  Eyes: Conjunctivae and EOM are normal. Pupils are equal, round, and reactive to light.  Cardiovascular: Normal rate and regular rhythm. Exam reveals no gallop, no distant heart sounds and no friction rub.  No murmur heard. Pulmonary/Chest: Effort normal. No respiratory distress. She has no decreased breath sounds. She has no wheezes. She has no rhonchi.   Lymphadenopathy:       Head (right side): No submandibular, no tonsillar, no preauricular and no posterior auricular adenopathy present.       Head (left side): No submandibular, no tonsillar, no preauricular and no posterior auricular adenopathy present.  Neurological: She is alert and oriented to person, place, and time.  Skin: She is not diaphoretic.  Psychiatric: She has a normal mood and affect. Her behavior is normal.     Assessment and Plan: Regina Reyes is a 62 y.o. female who is here today for cc of  Chief Complaint  Patient presents with  . Sinusitis    yellow-green mucus/ x 1o days  . Cough    x 10 days   Rhinosinusitis - Plan: doxycycline (VIBRAMYCIN) 100 MG capsule, ipratropium (ATROVENT) 0.03 % nasal spray  Regina Platt, PA-C [facility] 04/09/2017

## 2017-04-11 DIAGNOSIS — Z124 Encounter for screening for malignant neoplasm of cervix: Secondary | ICD-10-CM | POA: Diagnosis not present

## 2017-04-11 DIAGNOSIS — Z01419 Encounter for gynecological examination (general) (routine) without abnormal findings: Secondary | ICD-10-CM | POA: Diagnosis not present

## 2017-04-11 DIAGNOSIS — Z6827 Body mass index (BMI) 27.0-27.9, adult: Secondary | ICD-10-CM | POA: Diagnosis not present

## 2017-04-18 DIAGNOSIS — M609 Myositis, unspecified: Secondary | ICD-10-CM | POA: Diagnosis not present

## 2017-04-18 DIAGNOSIS — M9901 Segmental and somatic dysfunction of cervical region: Secondary | ICD-10-CM | POA: Diagnosis not present

## 2017-04-18 DIAGNOSIS — M9904 Segmental and somatic dysfunction of sacral region: Secondary | ICD-10-CM | POA: Diagnosis not present

## 2017-04-18 DIAGNOSIS — M25551 Pain in right hip: Secondary | ICD-10-CM | POA: Diagnosis not present

## 2017-04-24 ENCOUNTER — Other Ambulatory Visit: Payer: Self-pay | Admitting: Physician Assistant

## 2017-04-24 DIAGNOSIS — J329 Chronic sinusitis, unspecified: Secondary | ICD-10-CM

## 2017-04-24 NOTE — Telephone Encounter (Signed)
Last OV was 03/28/17 but only a 30 day supply with no refills was ordered of the Atrovent. [facility]ar [id number] [place]  [address]

## 2017-05-13 DIAGNOSIS — N8110 Cystocele, unspecified: Secondary | ICD-10-CM | POA: Diagnosis not present

## 2017-05-16 DIAGNOSIS — E038 Other specified hypothyroidism: Secondary | ICD-10-CM | POA: Diagnosis not present

## 2017-05-16 DIAGNOSIS — R7301 Impaired fasting glucose: Secondary | ICD-10-CM | POA: Diagnosis not present

## 2017-05-16 DIAGNOSIS — Z0000 Encounter for general adult medical examination without abnormal findings: Secondary | ICD-10-CM | POA: Diagnosis not present

## 2017-05-23 DIAGNOSIS — Z6829 Body mass index (BMI) 29.0-29.9, adult: Secondary | ICD-10-CM | POA: Diagnosis not present

## 2017-05-23 DIAGNOSIS — M545 Low back pain: Secondary | ICD-10-CM | POA: Diagnosis not present

## 2017-05-23 DIAGNOSIS — Z0000 Encounter for general adult medical examination without abnormal findings: Secondary | ICD-10-CM | POA: Diagnosis not present

## 2017-05-23 DIAGNOSIS — Z905 Acquired absence of kidney: Secondary | ICD-10-CM | POA: Diagnosis not present

## 2017-05-23 DIAGNOSIS — R7301 Impaired fasting glucose: Secondary | ICD-10-CM | POA: Diagnosis not present

## 2017-05-23 DIAGNOSIS — Z1389 Encounter for screening for other disorder: Secondary | ICD-10-CM | POA: Diagnosis not present

## 2017-05-23 DIAGNOSIS — Z524 Kidney donor: Secondary | ICD-10-CM | POA: Diagnosis not present

## 2017-05-30 DIAGNOSIS — Z1231 Encounter for screening mammogram for malignant neoplasm of breast: Secondary | ICD-10-CM | POA: Diagnosis not present

## 2017-06-04 DIAGNOSIS — Z1212 Encounter for screening for malignant neoplasm of rectum: Secondary | ICD-10-CM | POA: Diagnosis not present

## 2017-06-26 ENCOUNTER — Encounter: Payer: Self-pay | Admitting: Physician Assistant

## 2017-06-27 DIAGNOSIS — M25551 Pain in right hip: Secondary | ICD-10-CM | POA: Diagnosis not present

## 2017-06-27 DIAGNOSIS — M9904 Segmental and somatic dysfunction of sacral region: Secondary | ICD-10-CM | POA: Diagnosis not present

## 2017-06-27 DIAGNOSIS — M609 Myositis, unspecified: Secondary | ICD-10-CM | POA: Diagnosis not present

## 2017-06-27 DIAGNOSIS — M9901 Segmental and somatic dysfunction of cervical region: Secondary | ICD-10-CM | POA: Diagnosis not present

## 2017-08-05 DIAGNOSIS — N3946 Mixed incontinence: Secondary | ICD-10-CM | POA: Diagnosis not present

## 2017-08-08 DIAGNOSIS — N3946 Mixed incontinence: Secondary | ICD-10-CM | POA: Diagnosis not present

## 2017-08-12 DIAGNOSIS — N8110 Cystocele, unspecified: Secondary | ICD-10-CM | POA: Diagnosis not present

## 2017-08-20 DIAGNOSIS — R150 Incomplete defecation: Secondary | ICD-10-CM | POA: Diagnosis not present

## 2017-08-20 DIAGNOSIS — N813 Complete uterovaginal prolapse: Secondary | ICD-10-CM | POA: Diagnosis not present

## 2017-08-20 DIAGNOSIS — K5902 Outlet dysfunction constipation: Secondary | ICD-10-CM | POA: Diagnosis not present

## 2017-08-20 DIAGNOSIS — N8110 Cystocele, unspecified: Secondary | ICD-10-CM | POA: Diagnosis not present

## 2017-08-20 DIAGNOSIS — N858 Other specified noninflammatory disorders of uterus: Secondary | ICD-10-CM | POA: Diagnosis not present

## 2017-08-20 DIAGNOSIS — R198 Other specified symptoms and signs involving the digestive system and abdomen: Secondary | ICD-10-CM | POA: Diagnosis not present

## 2017-08-20 DIAGNOSIS — N812 Incomplete uterovaginal prolapse: Secondary | ICD-10-CM | POA: Diagnosis not present

## 2017-08-20 HISTORY — PX: ROBOTIC ASSISTED LAPAROSCOPIC SACROCOLPOPEXY: SHX5388

## 2017-08-21 DIAGNOSIS — R150 Incomplete defecation: Secondary | ICD-10-CM | POA: Diagnosis not present

## 2017-08-21 DIAGNOSIS — N813 Complete uterovaginal prolapse: Secondary | ICD-10-CM | POA: Diagnosis not present

## 2017-08-26 DIAGNOSIS — T8140XD Infection following a procedure, unspecified, subsequent encounter: Secondary | ICD-10-CM | POA: Diagnosis not present

## 2017-10-01 DIAGNOSIS — N3941 Urge incontinence: Secondary | ICD-10-CM | POA: Diagnosis not present

## 2017-10-01 DIAGNOSIS — Z8742 Personal history of other diseases of the female genital tract: Secondary | ICD-10-CM | POA: Diagnosis not present

## 2017-10-01 DIAGNOSIS — N952 Postmenopausal atrophic vaginitis: Secondary | ICD-10-CM | POA: Diagnosis not present

## 2017-10-01 DIAGNOSIS — N3281 Overactive bladder: Secondary | ICD-10-CM | POA: Diagnosis not present

## 2017-10-07 DIAGNOSIS — M9901 Segmental and somatic dysfunction of cervical region: Secondary | ICD-10-CM | POA: Diagnosis not present

## 2017-10-07 DIAGNOSIS — M9904 Segmental and somatic dysfunction of sacral region: Secondary | ICD-10-CM | POA: Diagnosis not present

## 2017-10-07 DIAGNOSIS — M609 Myositis, unspecified: Secondary | ICD-10-CM | POA: Diagnosis not present

## 2017-10-07 DIAGNOSIS — M25551 Pain in right hip: Secondary | ICD-10-CM | POA: Diagnosis not present

## 2017-11-05 DIAGNOSIS — L821 Other seborrheic keratosis: Secondary | ICD-10-CM | POA: Diagnosis not present

## 2017-11-05 DIAGNOSIS — Z808 Family history of malignant neoplasm of other organs or systems: Secondary | ICD-10-CM | POA: Diagnosis not present

## 2017-11-05 DIAGNOSIS — D2261 Melanocytic nevi of right upper limb, including shoulder: Secondary | ICD-10-CM | POA: Diagnosis not present

## 2017-11-05 DIAGNOSIS — D225 Melanocytic nevi of trunk: Secondary | ICD-10-CM | POA: Diagnosis not present

## 2017-12-09 DIAGNOSIS — M25551 Pain in right hip: Secondary | ICD-10-CM | POA: Diagnosis not present

## 2017-12-09 DIAGNOSIS — M609 Myositis, unspecified: Secondary | ICD-10-CM | POA: Diagnosis not present

## 2017-12-09 DIAGNOSIS — M9904 Segmental and somatic dysfunction of sacral region: Secondary | ICD-10-CM | POA: Diagnosis not present

## 2017-12-09 DIAGNOSIS — M9901 Segmental and somatic dysfunction of cervical region: Secondary | ICD-10-CM | POA: Diagnosis not present

## 2017-12-19 DIAGNOSIS — M609 Myositis, unspecified: Secondary | ICD-10-CM | POA: Diagnosis not present

## 2017-12-19 DIAGNOSIS — M9901 Segmental and somatic dysfunction of cervical region: Secondary | ICD-10-CM | POA: Diagnosis not present

## 2017-12-19 DIAGNOSIS — M9904 Segmental and somatic dysfunction of sacral region: Secondary | ICD-10-CM | POA: Diagnosis not present

## 2017-12-19 DIAGNOSIS — M25551 Pain in right hip: Secondary | ICD-10-CM | POA: Diagnosis not present

## 2018-01-01 DIAGNOSIS — M545 Low back pain: Secondary | ICD-10-CM | POA: Diagnosis not present

## 2018-01-01 DIAGNOSIS — E038 Other specified hypothyroidism: Secondary | ICD-10-CM | POA: Diagnosis not present

## 2018-01-01 DIAGNOSIS — Z23 Encounter for immunization: Secondary | ICD-10-CM | POA: Diagnosis not present

## 2018-01-01 DIAGNOSIS — K900 Celiac disease: Secondary | ICD-10-CM | POA: Diagnosis not present

## 2018-01-02 DIAGNOSIS — M609 Myositis, unspecified: Secondary | ICD-10-CM | POA: Diagnosis not present

## 2018-01-02 DIAGNOSIS — M9901 Segmental and somatic dysfunction of cervical region: Secondary | ICD-10-CM | POA: Diagnosis not present

## 2018-01-02 DIAGNOSIS — M9904 Segmental and somatic dysfunction of sacral region: Secondary | ICD-10-CM | POA: Diagnosis not present

## 2018-01-02 DIAGNOSIS — M25551 Pain in right hip: Secondary | ICD-10-CM | POA: Diagnosis not present

## 2018-01-09 DIAGNOSIS — M25551 Pain in right hip: Secondary | ICD-10-CM | POA: Diagnosis not present

## 2018-01-09 DIAGNOSIS — M609 Myositis, unspecified: Secondary | ICD-10-CM | POA: Diagnosis not present

## 2018-01-09 DIAGNOSIS — M9901 Segmental and somatic dysfunction of cervical region: Secondary | ICD-10-CM | POA: Diagnosis not present

## 2018-01-09 DIAGNOSIS — M9904 Segmental and somatic dysfunction of sacral region: Secondary | ICD-10-CM | POA: Diagnosis not present

## 2018-01-16 DIAGNOSIS — M9904 Segmental and somatic dysfunction of sacral region: Secondary | ICD-10-CM | POA: Diagnosis not present

## 2018-01-16 DIAGNOSIS — M25551 Pain in right hip: Secondary | ICD-10-CM | POA: Diagnosis not present

## 2018-01-16 DIAGNOSIS — M609 Myositis, unspecified: Secondary | ICD-10-CM | POA: Diagnosis not present

## 2018-01-16 DIAGNOSIS — M9901 Segmental and somatic dysfunction of cervical region: Secondary | ICD-10-CM | POA: Diagnosis not present

## 2018-01-23 DIAGNOSIS — M609 Myositis, unspecified: Secondary | ICD-10-CM | POA: Diagnosis not present

## 2018-01-23 DIAGNOSIS — M9904 Segmental and somatic dysfunction of sacral region: Secondary | ICD-10-CM | POA: Diagnosis not present

## 2018-01-23 DIAGNOSIS — M9901 Segmental and somatic dysfunction of cervical region: Secondary | ICD-10-CM | POA: Diagnosis not present

## 2018-01-23 DIAGNOSIS — M25551 Pain in right hip: Secondary | ICD-10-CM | POA: Diagnosis not present

## 2018-01-28 DIAGNOSIS — K5730 Diverticulosis of large intestine without perforation or abscess without bleeding: Secondary | ICD-10-CM | POA: Diagnosis not present

## 2018-01-28 DIAGNOSIS — K635 Polyp of colon: Secondary | ICD-10-CM | POA: Diagnosis not present

## 2018-01-28 DIAGNOSIS — Z1211 Encounter for screening for malignant neoplasm of colon: Secondary | ICD-10-CM | POA: Diagnosis not present

## 2018-01-28 DIAGNOSIS — D125 Benign neoplasm of sigmoid colon: Secondary | ICD-10-CM | POA: Diagnosis not present

## 2018-01-28 DIAGNOSIS — R109 Unspecified abdominal pain: Secondary | ICD-10-CM | POA: Diagnosis not present

## 2018-01-28 DIAGNOSIS — Z86010 Personal history of colonic polyps: Secondary | ICD-10-CM | POA: Diagnosis not present

## 2018-02-06 DIAGNOSIS — M9901 Segmental and somatic dysfunction of cervical region: Secondary | ICD-10-CM | POA: Diagnosis not present

## 2018-02-06 DIAGNOSIS — M25551 Pain in right hip: Secondary | ICD-10-CM | POA: Diagnosis not present

## 2018-02-06 DIAGNOSIS — M9904 Segmental and somatic dysfunction of sacral region: Secondary | ICD-10-CM | POA: Diagnosis not present

## 2018-02-06 DIAGNOSIS — M609 Myositis, unspecified: Secondary | ICD-10-CM | POA: Diagnosis not present

## 2018-03-13 DIAGNOSIS — M25551 Pain in right hip: Secondary | ICD-10-CM | POA: Diagnosis not present

## 2018-03-13 DIAGNOSIS — M9904 Segmental and somatic dysfunction of sacral region: Secondary | ICD-10-CM | POA: Diagnosis not present

## 2018-03-13 DIAGNOSIS — M9901 Segmental and somatic dysfunction of cervical region: Secondary | ICD-10-CM | POA: Diagnosis not present

## 2018-03-13 DIAGNOSIS — M609 Myositis, unspecified: Secondary | ICD-10-CM | POA: Diagnosis not present

## 2018-05-29 DIAGNOSIS — R82998 Other abnormal findings in urine: Secondary | ICD-10-CM | POA: Diagnosis not present

## 2018-05-29 DIAGNOSIS — E042 Nontoxic multinodular goiter: Secondary | ICD-10-CM | POA: Diagnosis not present

## 2018-05-29 DIAGNOSIS — Z0000 Encounter for general adult medical examination without abnormal findings: Secondary | ICD-10-CM | POA: Diagnosis not present

## 2018-05-29 DIAGNOSIS — R7301 Impaired fasting glucose: Secondary | ICD-10-CM | POA: Diagnosis not present

## 2018-06-05 DIAGNOSIS — E042 Nontoxic multinodular goiter: Secondary | ICD-10-CM | POA: Diagnosis not present

## 2018-06-05 DIAGNOSIS — E038 Other specified hypothyroidism: Secondary | ICD-10-CM | POA: Diagnosis not present

## 2018-06-05 DIAGNOSIS — Z23 Encounter for immunization: Secondary | ICD-10-CM | POA: Diagnosis not present

## 2018-06-05 DIAGNOSIS — R7301 Impaired fasting glucose: Secondary | ICD-10-CM | POA: Diagnosis not present

## 2018-06-05 DIAGNOSIS — Z0000 Encounter for general adult medical examination without abnormal findings: Secondary | ICD-10-CM | POA: Diagnosis not present

## 2019-04-30 ENCOUNTER — Other Ambulatory Visit: Payer: Self-pay | Admitting: Internal Medicine

## 2019-04-30 DIAGNOSIS — N1832 Chronic kidney disease, stage 3b: Secondary | ICD-10-CM

## 2019-05-06 ENCOUNTER — Ambulatory Visit
Admission: RE | Admit: 2019-05-06 | Discharge: 2019-05-06 | Disposition: A | Discharge status: Home / Self Care | Payer: Managed Care, Other (non HMO) | Source: Ambulatory Visit | Attending: Internal Medicine | Admitting: Internal Medicine

## 2019-05-06 DIAGNOSIS — N1832 Chronic kidney disease, stage 3b: Secondary | ICD-10-CM

## 2020-03-26 DIAGNOSIS — Z8616 Personal history of COVID-19: Secondary | ICD-10-CM

## 2020-03-26 HISTORY — DX: Personal history of COVID-19: Z86.16

## 2021-01-04 ENCOUNTER — Other Ambulatory Visit: Payer: Self-pay

## 2021-01-04 ENCOUNTER — Encounter (HOSPITAL_COMMUNITY): Payer: Self-pay | Admitting: Emergency Medicine

## 2021-01-04 ENCOUNTER — Emergency Department (HOSPITAL_COMMUNITY)
Admission: EM | Admit: 2021-01-04 | Discharge: 2021-01-05 | Disposition: A | Discharge status: Home / Self Care | Payer: Managed Care, Other (non HMO) | Attending: Emergency Medicine | Admitting: Emergency Medicine

## 2021-01-04 ENCOUNTER — Emergency Department (HOSPITAL_COMMUNITY): Payer: Managed Care, Other (non HMO)

## 2021-01-04 DIAGNOSIS — H538 Other visual disturbances: Secondary | ICD-10-CM | POA: Diagnosis not present

## 2021-01-04 DIAGNOSIS — H539 Unspecified visual disturbance: Secondary | ICD-10-CM

## 2021-01-04 LAB — I-STAT CHEM 8, ED
BUN: 31 mg/dL — ABNORMAL HIGH (ref 8–23)
Calcium, Ion: 0.97 mmol/L — ABNORMAL LOW (ref 1.15–1.40)
Chloride: 107 mmol/L (ref 98–111)
Creatinine, Ser: 1.2 mg/dL — ABNORMAL HIGH (ref 0.44–1.00)
Glucose, Bld: 92 mg/dL (ref 70–99)
HCT: 41 % (ref 36.0–46.0)
Hemoglobin: 13.9 g/dL (ref 12.0–15.0)
Potassium: 5.2 mmol/L — ABNORMAL HIGH (ref 3.5–5.1)
Sodium: 137 mmol/L (ref 135–145)
TCO2: 26 mmol/L (ref 22–32)

## 2021-01-04 NOTE — ED Provider Notes (Signed)
[facility] EMERGENCY DEPARTMENT Provider Note   CSN: [id number] Arrival date & time: 01/04/21  1922     History Chief Complaint  Patient presents with   seeing flashing lights    Regina Reyes is a 65 y.o. female.  HPI Patient presents with left eye vision disturbance.  She is here with her husband who assists with the history. She notes no history of true ocular disease.  About 4 hours prior to my evaluation she began having visual disturbance in the left lateral peripheral field.  She describes it as flashing lights, with occasional floaters, and ribbonlike sensation.  No complete loss of vision, no true field cuts.  No temporal headache, nausea, vomiting. No clear alleviating, exacerbating, precipitating factors. Patient was well prior to this, she does note chiropractic manipulation yesterday, with some neck soreness.  No weakness in any extremity or other focal complaints.    Past Medical History:  Diagnosis Date   Anxiety    Arthritis    Celiac syndrome    History of inguinal hernia repair    Thyroid disease     There are no problems to display for this patient.   Past Surgical History:  Procedure Laterality Date   APPENDECTOMY  1973   COLONOSCOPY  2004, 2009, 2012   HERNIA REPAIR  2002   RIH   NEPHRECTOMY       OB History   No obstetric history on file.     Family History  Problem Relation Age of Onset   Cancer Father        prostate   Hypertension Father    Diabetes Father    Thyroid disease Mother    Thyroid disease Sister    Arthritis Maternal Grandmother    Heart disease Maternal Grandmother    Heart disease Maternal Grandfather    Heart disease Paternal Grandmother    Cancer Maternal Aunt        breast    Social History   Tobacco Use   Smoking status: Never   Smokeless tobacco: Never  Substance Use Topics   Alcohol use: Yes   Drug use: No    Home Medications Prior to Admission medications   Medication Sig Start  Date End Date Taking? Authorizing Provider  azithromycin (ZITHROMAX) 250 MG tablet Use as a zpack Patient not taking: Reported on 03/28/2017 12/05/14   Copland, Gwenlyn Found, MD  chlorpheniramine-HYDROcodone (TUSSIONEX PENNKINETIC ER) 10-8 MG/5ML SUER Take 5 mLs by mouth 2 (two) times daily. Patient not taking: Reported on 03/28/2017 11/30/14   Carmelina Dane, MD  clindamycin (CLEOCIN) 300 MG capsule Take 1 capsule (300 mg total) by mouth 3 (three) times daily. 04/04/17   Trena Platt D, PA  desonide (DESOWEN) 0.05 % ointment Ad lib. 02/01/11   [provider]  diclofenac sodium (VOLTAREN) 1 % GEL Apply 2 g topically 4 (four) times daily. 03/18/16   Joy, Shawn C, PA-C  Emollient (HYLATOPIC PLUS) CREA Ad lib. 02/01/11   [provider]  Estradiol (VAGIFEM) 10 MCG TABS vaginal tablet Place vaginally.    [provider]  Estradiol-Norethindrone Acet 0.5-0.1 MG per tablet  03/23/11   [provider]  ipratropium (ATROVENT) 0.03 % nasal spray PLACE 2 SPRAYS INTO BOTH NOSTRILS 2 (TWO) TIMES DAILY. 04/25/17   Trena Platt D, PA  multivitamin [employer]) per tablet Take 1 tablet by mouth daily.    [provider]  Nutritional Supplements (PYCNOGENOL PO) Take 100 mg by mouth daily.  [provider]  SYNTHROID 100 MCG tablet daily. 02/11/11   [provider]  TRAMADOL HCL PO Take by mouth.    [provider]    Allergies    Penicillins  Review of Systems   Review of Systems  Constitutional:        Per HPI, otherwise negative  HENT:         Per HPI, otherwise negative  Eyes:  Positive for photophobia and visual disturbance. Negative for pain and discharge.  Respiratory:         Per HPI, otherwise negative  Cardiovascular:        Per HPI, otherwise negative  Gastrointestinal:  Negative for vomiting.  Endocrine:       Negative aside from HPI  Genitourinary:        Neg aside from HPI   Musculoskeletal:        Per  HPI, otherwise negative  Skin: Negative.   Neurological:  Negative for syncope.   Physical Exam Updated Vital Signs BP 135/85 (BP Location: Right Arm)   Pulse (!) 56   Temp (!) 97.3 F (36.3 C) (Oral)   Resp 15   Ht 6' (1.829 m)   Wt 93.4 kg   SpO2 98%   BMI 27.94 kg/m   Physical Exam Vitals and nursing note reviewed.  Constitutional:      General: She is not in acute distress.    Appearance: She is well-developed.  HENT:     Head: Normocephalic and atraumatic.  Eyes:     General: Lids are normal. No visual field deficit or scleral icterus.       Right eye: No discharge.        Left eye: No discharge.     Extraocular Movements:     Right eye: Normal extraocular motion.     Left eye: Normal extraocular motion.     Conjunctiva/sclera: Conjunctivae normal.     Right eye: Right conjunctiva is not injected. No hemorrhage.    Left eye: Left conjunctiva is not injected. No hemorrhage.  Cardiovascular:     Rate and Rhythm: Normal rate and regular rhythm.  Pulmonary:     Effort: Pulmonary effort is normal. No respiratory distress.     Breath sounds: Normal breath sounds. No stridor.  Abdominal:     General: There is no distension.  Skin:    General: Skin is warm and dry.  Neurological:     Mental Status: She is alert and oriented to person, place, and time.     Cranial Nerves: No cranial nerve deficit.     Motor: No weakness.     Coordination: Coordination normal.    ED Results / Procedures / Treatments   Labs (all labs ordered are listed, but only abnormal results are displayed) Labs Reviewed  I-STAT CHEM 8, ED - Abnormal; Notable for the following components:      Result Value   Potassium 5.2 (*)    BUN 31 (*)    Creatinine, Ser 1.20 (*)    Calcium, Ion 0.97 (*)    All other components within normal limits    EKG None  Radiology No results found.  Procedures Procedures   Medications Ordered in ED Medications - No data to display  ED Course  I have  reviewed the triage vital signs and the nursing notes.  Pertinent labs & imaging results that were available during my care of the patient were reviewed by me and considered in my medical  decision making (see chart for details).  Previously well female presents with new left eye visual disturbance.  No complete vision loss, no acuity loss, lower suspicion for retinal detachment, some suspicion for vitreous hemorrhage versus detachment.  Given the patient's recent chiropractor manipulation, other vascular etiology considered, and patient is awaiting CT imaging. Labs notable for mild elevation in creatinine, vitals unremarkable.  On signout the patient is awaiting imaging, as above, will require repeat evaluation, may be appropriate for discharge with ophthalmology follow-up as needed.  Dr. Bebe Shaggy is aware. Final Clinical Impression(s) / ED Diagnoses Final diagnoses:  Vision disturbance     Gerhard Munch, MD 01/04/21 2339

## 2021-01-04 NOTE — ED Triage Notes (Signed)
Pt reports she was driving home about 5:18 this afternoon and saw a gray swirly line and then floaters.  Pt indicated when she got home she started to see flashes light in her left eye.  She has a hx of floaters when she played tennis in the past but not recently.  Pt has no neuro deficits at this time.

## 2021-01-04 NOTE — ED Provider Notes (Signed)
Emergency Medicine Provider Triage Evaluation Note  Regina Reyes , a 65 y.o. female  was evaluated in triage.  Pt complains of flashing lights.  Started around 6:30 PM today, happens episodically.  Also sees floaters which she is seen in the past.  She thinks it is mostly on the left side of her visual field..  Denies any pain with eye movement, does not wear contacts or glasses.  Denies any headache, nausea, vomiting.  No lateralized weakness.  Review of Systems  Positive: Flashing lights, floaters Negative: Weakness, numbness, headache, nausea, vomiting, complete vision loss, pain with EOM  Physical Exam  There were no vitals taken for this visit. Gen:   Awake, no distress   Resp:  Normal effort  MSK:   Moves extremities without difficulty  Other:  Cranial nerves III through XII grossly intact.  Grip strength equal bilaterally, lower extremity strength equal bilaterally.  No nystagmus.  Intact to confrontation.  Medical Decision Making  Medically screening exam initiated at 8:04 PM.  Appropriate orders placed.  Abbagail Scaff was informed that the remainder of the evaluation will be completed by another provider, this initial triage assessment does not replace that evaluation, and the importance of remaining in the ED until their evaluation is complete.  Discussed with Dr. Stevie Kern, he advises holding off on labs and imaging.  No lateralizing symptoms, doubt stroke. The flashing lights come and go, not consistent with a retinal hemorrhage.    Theron Arista, PA-C 01/04/21 2013    Milagros Loll, MD 01/05/21 [phone]

## 2021-01-05 MED ORDER — IOHEXOL 350 MG/ML SOLN
75.0000 mL | Freq: Once | INTRAVENOUS | Status: AC | PRN
  Administered 2021-01-04: 75 mL via INTRAVENOUS

## 2021-01-05 NOTE — ED Provider Notes (Signed)
I assumed care at signout to follow-up on CT imaging.  CT angio head and neck were negative.  Patient has  reported left-sided visual disturbances that has been stable.  No other focal neurologic deficit. At this point, patient is safe for discharge home.  She is awake alert reports she is ambulatory and no other acute complaints Visual acuity is noted at 20/20 Patient will follow-up with ophthalmology as this is likely a primary ophthalmologic issue   Zadie Rhine, MD 01/05/21 0130

## 2021-01-30 ENCOUNTER — Encounter (HOSPITAL_BASED_OUTPATIENT_CLINIC_OR_DEPARTMENT_OTHER): Payer: Self-pay | Admitting: Podiatry

## 2021-01-30 ENCOUNTER — Ambulatory Visit: Payer: Self-pay | Admitting: Podiatry

## 2021-01-30 NOTE — H&P (Signed)
Complaint: History of present illness: She has chronic pain in the RIGHT fifth metatarsal head. She saw Dr. Elvin So and had several injections which have worked well. She wears custom orthotics and wider shoes. She has some off time coming up for slow time at work and feels like she could get this surgery done bone for the end of the year. She been thinking about the tailor's bunion surgery and was advised that this would be helpful since she has had all conservative care up to this point. Reviewed Dr. Dorisann Frames notes and x-ray findings which indicate splaying of the fourth and fifth metatarsal lateral bowing of the fifth metatarsal neck with tailor's bunion deformity. She has pain most days despite wearing accommodative shoes and orthotics. The injection helped quite a bit and it feels better recently. She is worried its can start hurting again.  Current Medication: Other MD:  1. Estradiol-noreth 0.5-0.1 Mg Tb (LEWIS, ANDREW)  SIG: TAKE 1 TABLET BY MOUTH DAILY 2. Synthroid 100 Mcg Tablet  SIG: 1 every morning  ROS: Integument:  (-) for itching; (-) for psoriasis; (-) for eczema; (-) for hives; (-) for rash; (-) for wounds; (-) for skin cancer. Musculoskeletal:  (+) for arthritis; (-) for stiffness; (+) for low back pain; (-) for bursitis; (-) for gout; (-) for knee pain; (-) for hip pain. Constitutional:  (-) for fever; (-) for chills; (-) for nausea; (-) for weight gain; (+) for fatigue; (-) for weight loss.  Medical History: Arthritis.  Thyroid Problems.  Vascular grafts: None.  Joint implant(s): Positive for joint implant.   Surgeries: appendectomy 1973 NO COMPLICATIONS; hernia repair 2002 NO COMPLICATIONS; nephrectomy (donation) 2016 NO COMPLICATIONS; hip replacement 2016 NO COMPLICATIONS; bladder prolapse surgery 2018 NO COMPLICATIONS .  Family History/Social History: Diabetes: Father. Arthritis: Mother. Cancer: Father. Stroke: Mother. High Blood Pressure: Father.  Patient  denies being currently pregnant.  Smoking History: Patient denies tobacco use. Alcohol Use:  Patient drinks moderately.  INSULIN: Patient denies taking insulin. COUMADIN/PLAVIX USE: Patient denies taking coumadin/plavix.  Allergy: Latex, Penicillins  Examination: EXAM FINDINGS: DP pulse palpable, she is alert and oriented 3. Good muscle strength and muscle tone with clawing of the lesser digits and mallet toe deformity. She has prominent styloid process bilaterally. Fifth metatarsal is enlarged and prominent on the RIGHT side compared to the LEFT. No crepitation along the fifth ray. No significant effusion. There is erythema and bursa formation at the dorsal toes and hallux IP joint but she does not have pain there.  Diagnosis: M65.871  Tenosynovitis RIGHT  M21.621  [Bunionette of right foot]   PLAN: Clinical Summary Letter with office note for today's visit is available to patient through the online patient portal.  Initial office visit to take a history, form a diagnosis and a treatment plan  Diagnosis: Tailor's Bunion RIGHT; Tenosynovitis RIGHT  EVALUATION & MANAGEMENT: Discussion: surgery (Conservative versus surgical options for tailors bunion, discussed minimal incision surgery osteotomy with pin fixation, wearing the Cam walker for 5-6 weeks pulling the pin out percutaneously at five weeks. She wants to talk about things at work and consider scheduling this for November). Discussed surgical options versus conservative care along with nature of procedure and post op course.  Recommendations:   Use of pneumatic cam walker to offload the foot and ankle, alleviate contracture. Surgical Consultation: Discussed surgical versus conservative treatment options. Risks versus benefits were discussed along with the nature of the procedure, post-operative course. Discussed possible complications, not limited to, such as: slow-healing, infection,  need for further surgery, chronic pain (RSDS),  blood clots, bleeding problems, weakness, chronic swelling of the foot/digits, and arthritis. Rare but serious complications can even result in loss of digit, loss of limb, or loss of life. No guarantees were given. Alternatives to surgery were discussed today.  PLANNED PROCEDURE(S). Right: Repair tailor's bunion with minimal incision surgery 0.045 K wire fixation RIGHT foot with osteotomy, CPT code 49675.   We will begin planning for scheduling the surgery based on patient's preference. Venipuncture: Labs drawn for Surgical profile.  DME PRESCRIPTIONS:   (Length of need of Contracture splint, CAM walker listed below: 99 months)  Rx: Pneumatic Cam Walker Disp: Right; Diagnosis: Tenosynovitis RIGHT . Sig: Use as directed while weightbearing. Can remove for range of motion and bathing. This has a rocker bottom to offload the foot during ambulation. Can be removed when nonweightbearing to perform range of motion exercises and keep muscles toned. This should reduce the risk of blood clots compared to a fiberglass cast. Patient instructed on the use of the device. .  CC: TISOVEC, RICHARD : 01/13/2021   This visit note has been electronically signed off by Sherlynn Stalls, DPM on 01/11/2021 at 05:20 PM.

## 2021-02-01 ENCOUNTER — Encounter (HOSPITAL_BASED_OUTPATIENT_CLINIC_OR_DEPARTMENT_OTHER): Payer: Self-pay | Admitting: Podiatry

## 2021-02-03 ENCOUNTER — Encounter (HOSPITAL_BASED_OUTPATIENT_CLINIC_OR_DEPARTMENT_OTHER): Payer: Self-pay | Admitting: Podiatry

## 2021-02-03 ENCOUNTER — Other Ambulatory Visit: Payer: Self-pay

## 2021-02-03 NOTE — Progress Notes (Addendum)
ADDENDUM:  Received pt's pcp H&P dated 01-24-2021 by Mcneil Sober FNP via fax from Dr Suzette Battiest office , with chart.   Spoke w/ via phone for pre-op interview--- pt Lab needs dos----  Mirant results------ no COVID test -----patient states asymptomatic no test needed Arrive at ------- 1230 on 02-06-2021 NPO after MN NO Solid Food.  Clear liquids from MN until--- 1200 Med rec completed Medications to take morning of surgery ----- synthroid, mimvey Diabetic medication ----- n/a Patient instructed no nail polish to be worn day of surgery Patient instructed to bring photo id and insurance card day of surgery Patient aware to have Driver (ride ) / caregiver  for 24 hours after surgery --husband, gerald Patient Special Instructions ----- n/a  Pre-Op special Istructions ----- have not received pt's pcp H&P yet.  Called Dr Suzette Battiest office , spoke w Joice Lofts (OR scheduler) inquired about status.  Amber stated pt's pcp was to fax yesterday and she now looking the faxes for it and will fax.  Patient verbalized understanding of instructions that were given at this phone interview. Patient denies shortness of breath, chest pain, fever, cough at this phone interview.    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 [place] (lov 12-13-2020

## 2021-02-06 ENCOUNTER — Encounter (HOSPITAL_BASED_OUTPATIENT_CLINIC_OR_DEPARTMENT_OTHER)
Admission: RE | Disposition: A | Discharge status: Home / Self Care | Payer: Self-pay | Source: Home / Self Care | Attending: Podiatry

## 2021-02-06 ENCOUNTER — Ambulatory Visit (HOSPITAL_BASED_OUTPATIENT_CLINIC_OR_DEPARTMENT_OTHER): Payer: Managed Care, Other (non HMO) | Admitting: Anesthesiology

## 2021-02-06 ENCOUNTER — Ambulatory Visit (HOSPITAL_BASED_OUTPATIENT_CLINIC_OR_DEPARTMENT_OTHER)
Admission: RE | Admit: 2021-02-06 | Discharge: 2021-02-06 | Disposition: A | Discharge status: Home / Self Care | Payer: Managed Care, Other (non HMO) | Attending: Podiatry | Admitting: Podiatry

## 2021-02-06 ENCOUNTER — Encounter (HOSPITAL_BASED_OUTPATIENT_CLINIC_OR_DEPARTMENT_OTHER): Payer: Self-pay | Admitting: Podiatry

## 2021-02-06 ENCOUNTER — Other Ambulatory Visit: Payer: Self-pay

## 2021-02-06 DIAGNOSIS — M2011 Hallux valgus (acquired), right foot: Secondary | ICD-10-CM | POA: Diagnosis not present

## 2021-02-06 DIAGNOSIS — M1990 Unspecified osteoarthritis, unspecified site: Secondary | ICD-10-CM | POA: Diagnosis not present

## 2021-02-06 DIAGNOSIS — M21621 Bunionette of right foot: Secondary | ICD-10-CM | POA: Insufficient documentation

## 2021-02-06 DIAGNOSIS — N183 Chronic kidney disease, stage 3 unspecified: Secondary | ICD-10-CM | POA: Insufficient documentation

## 2021-02-06 HISTORY — DX: Age-related osteoporosis without current pathological fracture: M81.0

## 2021-02-06 HISTORY — PX: METATARSAL OSTEOTOMY: SHX1641

## 2021-02-06 HISTORY — DX: Chronic obstructive pulmonary disease, unspecified: J44.9

## 2021-02-06 HISTORY — DX: Personal history of other diseases of the musculoskeletal system and connective tissue: Z87.39

## 2021-02-06 HISTORY — DX: Celiac disease: K90.0

## 2021-02-06 HISTORY — DX: Acquired absence of kidney: Z90.5

## 2021-02-06 HISTORY — DX: Other specified hypothyroidism: E03.8

## 2021-02-06 HISTORY — DX: Stress incontinence (female) (male): N39.3

## 2021-02-06 HISTORY — DX: Gastro-esophageal reflux disease without esophagitis: K21.9

## 2021-02-06 HISTORY — DX: Pain in right foot: M79.671

## 2021-02-06 HISTORY — DX: Other specified health status: Z78.9

## 2021-02-06 HISTORY — DX: Chronic kidney disease, stage 3 unspecified: N18.30

## 2021-02-06 HISTORY — DX: Non-celiac gluten sensitivity: K90.41

## 2021-02-06 HISTORY — DX: Rosacea, unspecified: L71.9

## 2021-02-06 HISTORY — DX: Acne, unspecified: L70.9

## 2021-02-06 HISTORY — DX: Personal history of colonic polyps: Z86.010

## 2021-02-06 HISTORY — DX: Nausea with vomiting, unspecified: R11.2

## 2021-02-06 LAB — POCT I-STAT, CHEM 8
BUN: 22 mg/dL (ref 8–23)
Calcium, Ion: 1.24 mmol/L (ref 1.15–1.40)
Chloride: 106 mmol/L (ref 98–111)
Creatinine, Ser: 1.2 mg/dL — ABNORMAL HIGH (ref 0.44–1.00)
Glucose, Bld: 88 mg/dL (ref 70–99)
HCT: 41 % (ref 36.0–46.0)
Hemoglobin: 13.9 g/dL (ref 12.0–15.0)
Potassium: 4.2 mmol/L (ref 3.5–5.1)
Sodium: 141 mmol/L (ref 135–145)
TCO2: 24 mmol/L (ref 22–32)

## 2021-02-06 SURGERY — OSTEOTOMY, METATARSAL BONE
Anesthesia: Monitor Anesthesia Care | Site: Toe | Laterality: Right

## 2021-02-06 MED ORDER — FENTANYL CITRATE (PF) 100 MCG/2ML IJ SOLN
INTRAMUSCULAR | Status: AC
  Filled 2021-02-06: qty 2

## 2021-02-06 MED ORDER — FENTANYL CITRATE (PF) 100 MCG/2ML IJ SOLN: 25.0000 ug | INTRAMUSCULAR | Status: DC | PRN

## 2021-02-06 MED ORDER — CLINDAMYCIN PHOSPHATE 900 MG/50ML IV SOLN
INTRAVENOUS | Status: AC
  Filled 2021-02-06: qty 50

## 2021-02-06 MED ORDER — PROPOFOL 10 MG/ML IV BOLUS
INTRAVENOUS | Status: DC | PRN
  Administered 2021-02-06 (×2): 20 mg via INTRAVENOUS

## 2021-02-06 MED ORDER — CLINDAMYCIN PHOSPHATE 900 MG/50ML IV SOLN
900.0000 mg | INTRAVENOUS | Status: AC
  Administered 2021-02-06: 900 mg via INTRAVENOUS

## 2021-02-06 MED ORDER — CHLORHEXIDINE GLUCONATE CLOTH 2 % EX PADS: 6.0000 | MEDICATED_PAD | Freq: Once | CUTANEOUS | Status: DC

## 2021-02-06 MED ORDER — CLONIDINE HCL (ANALGESIA) 100 MCG/ML EP SOLN
EPIDURAL | Status: DC | PRN
  Administered 2021-02-06: 100 ug

## 2021-02-06 MED ORDER — ROPIVACAINE HCL 5 MG/ML IJ SOLN
INTRAMUSCULAR | Status: DC | PRN
  Administered 2021-02-06: 25 mL via PERINEURAL

## 2021-02-06 MED ORDER — SODIUM CHLORIDE 0.9 % IV SOLN: INTRAVENOUS | Status: DC

## 2021-02-06 MED ORDER — FENTANYL CITRATE (PF) 100 MCG/2ML IJ SOLN
50.0000 ug | Freq: Once | INTRAMUSCULAR | Status: AC
  Administered 2021-02-06: 50 ug via INTRAVENOUS

## 2021-02-06 MED ORDER — ACETAMINOPHEN 10 MG/ML IV SOLN: 1000.0000 mg | Freq: Once | INTRAVENOUS | Status: DC | PRN

## 2021-02-06 MED ORDER — LIDOCAINE HCL (CARDIAC) PF 100 MG/5ML IV SOSY
PREFILLED_SYRINGE | INTRAVENOUS | Status: DC | PRN
  Administered 2021-02-06: 20 mg via INTRAVENOUS

## 2021-02-06 MED ORDER — PROMETHAZINE HCL 25 MG/ML IJ SOLN: 6.2500 mg | INTRAMUSCULAR | Status: DC | PRN

## 2021-02-06 MED ORDER — MIDAZOLAM HCL 2 MG/2ML IJ SOLN
1.0000 mg | Freq: Once | INTRAMUSCULAR | Status: AC
  Administered 2021-02-06: 1 mg via INTRAVENOUS

## 2021-02-06 MED ORDER — PROPOFOL 500 MG/50ML IV EMUL
INTRAVENOUS | Status: DC | PRN
  Administered 2021-02-06: 50 ug/kg/min via INTRAVENOUS

## 2021-02-06 MED ORDER — MIDAZOLAM HCL 2 MG/2ML IJ SOLN
INTRAMUSCULAR | Status: AC
  Filled 2021-02-06: qty 2

## 2021-02-06 MED ORDER — ONDANSETRON HCL 4 MG/2ML IJ SOLN
INTRAMUSCULAR | Status: DC | PRN
  Administered 2021-02-06: 4 mg via INTRAVENOUS

## 2021-02-06 SURGICAL SUPPLY — 49 items
BLADE AVERAGE 25X9 (BLADE) ×2 IMPLANT
BLADE SURG 15 STRL LF DISP TIS (BLADE) ×1 IMPLANT
BLADE SURG 15 STRL SS (BLADE) ×2
BNDG CMPR 9X4 STRL LF SNTH (GAUZE/BANDAGES/DRESSINGS) ×1
BNDG COHESIVE 3X5 TAN STRL LF (GAUZE/BANDAGES/DRESSINGS) ×2 IMPLANT
BNDG COHESIVE 6X5 TAN NS LF (GAUZE/BANDAGES/DRESSINGS) ×1 IMPLANT
BNDG CONFORM 3 STRL LF (GAUZE/BANDAGES/DRESSINGS) ×2 IMPLANT
BNDG ESMARK 4X9 LF (GAUZE/BANDAGES/DRESSINGS) ×2 IMPLANT
BNDG GAUZE ELAST 4 BULKY (GAUZE/BANDAGES/DRESSINGS) ×1 IMPLANT
CLOTH BEACON ORANGE TIMEOUT ST (SAFETY) ×2 IMPLANT
COVER BACK TABLE 60X90IN (DRAPES) ×2 IMPLANT
DRAPE C-ARM 35X43 STRL (DRAPES) ×2 IMPLANT
DRAPE EXTREMITY T 121X128X90 (DISPOSABLE) ×2 IMPLANT
DRAPE SHEET LG 3/4 BI-LAMINATE (DRAPES) ×2 IMPLANT
ELECT REM PT RETURN 9FT ADLT (ELECTROSURGICAL)
ELECTRODE REM PT RTRN 9FT ADLT (ELECTROSURGICAL) IMPLANT
GAUZE 4X4 16PLY ~~LOC~~+RFID DBL (SPONGE) ×2 IMPLANT
GAUZE SPONGE 4X4 12PLY STRL (GAUZE/BANDAGES/DRESSINGS) ×2 IMPLANT
GAUZE XEROFORM 1X8 LF (GAUZE/BANDAGES/DRESSINGS) IMPLANT
GAUZE XEROFORM 5X9 LF (GAUZE/BANDAGES/DRESSINGS) ×1 IMPLANT
GLOVE SURG POLYISO LF SZ8 (GLOVE) ×2 IMPLANT
GOWN STRL REUS W/TWL LRG LVL3 (GOWN DISPOSABLE) ×4 IMPLANT
K-WIRE CAPS STERILE WHITE .045 (WIRE) ×1 IMPLANT
K-WIRE DBL END TROCAR 6X.045 (WIRE) ×2
KIT TURNOVER CYSTO (KITS) ×2 IMPLANT
KWIRE DBL END TROCAR 6X.045 (WIRE) IMPLANT
MANIFOLD NEPTUNE II (INSTRUMENTS) IMPLANT
NDL HYPO 25X1 1.5 SAFETY (NEEDLE) ×1 IMPLANT
NEEDLE HYPO 25X1 1.5 SAFETY (NEEDLE) ×2 IMPLANT
NS IRRIG 500ML POUR BTL (IV SOLUTION) ×2 IMPLANT
PACK BASIN DAY SURGERY FS (CUSTOM PROCEDURE TRAY) ×2 IMPLANT
PADDING CAST ABS 4INX4YD NS (CAST SUPPLIES) ×1
PADDING CAST ABS COTTON 4X4 ST (CAST SUPPLIES) ×1 IMPLANT
PENCIL SMOKE EVACUATOR (MISCELLANEOUS) IMPLANT
STOCKINETTE 4X48 STRL (DRAPES) ×2 IMPLANT
SUCTION FRAZIER HANDLE 10FR (MISCELLANEOUS)
SUCTION TUBE FRAZIER 10FR DISP (MISCELLANEOUS) IMPLANT
SUT ETHILON 4 0 PS 2 18 (SUTURE) ×2 IMPLANT
SUT MNCRL AB 4-0 PS2 18 (SUTURE) IMPLANT
SUT VIC AB 3-0 SH 27 (SUTURE)
SUT VIC AB 3-0 SH 27X BRD (SUTURE) IMPLANT
SUT VIC AB 4-0 PS2 18 (SUTURE) ×2 IMPLANT
SYR BULB EAR ULCER 3OZ GRN STR (SYRINGE) ×2 IMPLANT
SYR CONTROL 10ML LL (SYRINGE) ×2 IMPLANT
TOWEL OR 17X26 10 PK STRL BLUE (TOWEL DISPOSABLE) ×6 IMPLANT
TUBE CONNECTING 12X1/4 (SUCTIONS) ×2 IMPLANT
UNDERPAD 30X36 HEAVY ABSORB (UNDERPADS AND DIAPERS) ×2 IMPLANT
WATER STERILE IRR 500ML POUR (IV SOLUTION) ×2 IMPLANT
k-wire ×1 IMPLANT

## 2021-02-06 NOTE — Transfer of Care (Signed)
Immediate Anesthesia Transfer of Care Note  Patient: Regina Reyes  Procedure(s) Performed: REPAIR TAYLORS BUNION WITH MINIMAL INCISION -KWIRE FIXATION WITH OSTEOTOMY (Right: Toe)  Patient Location: PACU  Anesthesia Type:MAC and MAC combined with regional for post-op pain  Level of Consciousness: awake, alert , oriented and patient cooperative  Airway & Oxygen Therapy: Patient Spontanous Breathing  Post-op Assessment: Report given to RN and Post -op Vital signs reviewed and stable  Post vital signs: Reviewed and stable  Last Vitals:  Vitals Value Taken Time  BP 118/67 02/06/21 1538  Temp    Pulse 57 02/06/21 1541  Resp 5 02/06/21 1541  SpO2 96 % 02/06/21 1541  Vitals shown include unvalidated device data.  Last Pain:  Vitals:   02/06/21 1233  TempSrc: Oral  PainSc: 3       Patients Stated Pain Goal: 7 (16/10/96 0454)  Complications: No notable events documented.

## 2021-02-06 NOTE — Discharge Instructions (Addendum)
Post op instructions Dr. Sherlynn Stalls, DPM [facility]  1. Take your medication as prescribed.  Pain medication should be taken only as needed.  2.  Keep the dressing clean, dry and intact.  3.  Keep your foot elevated above the heart level for the first 48 hours.  4.  Walking to the bathroom and brief periods of walking are acceptable unless    we have instructed you to be non-weight bearing.  5.  Always wear your Cam walker, walking boot when walking.  Always use your crutches if    you are to be non-weight bearing.  6.  Do not take a shower.  Baths are permissible as long as the foot is kept out of water.  7.  Every hour you are awake:   Bend your knee 15 times  Flex foot 15 times  Massage 15 times  8.  Call [facility], [phone] if any of the following problems occur    You develop a temperature or fever  The bandage becomes saturated with blood  Medication does not stop your pain  Injury of the foot occurs  Any symptoms of infection including redness, odor, or red streaks     Regional Anesthesia Blocks  1. Numbness or the inability to move the "blocked" extremity may last from 3-48 hours after placement. The length of time depends on the medication injected and your individual response to the medication. If the numbness is not going away after 48 hours, call your surgeon.  2. The extremity that is blocked will need to be protected until the numbness is gone and the  Strength has returned. Because you cannot feel it, you will need to take extra care to avoid injury. Because it may be weak, you may have difficulty moving it or using it. You may not know what position it is in without looking at it while the block is in effect.  3. For blocks in the legs and feet, returning to weight bearing and walking needs to be done carefully. You will need to wait until the numbness is entirely gone and the strength has returned. You should be able to move your leg  and foot normally before you try and bear weight or walk. You will need someone to be with you when you first try to ensure you do not fall and possibly risk injury.  4. Bruising and tenderness at the needle site are common side effects and will resolve in a few days.  5. Persistent numbness or new problems with movement should be communicated to the surgeon or the [facility] [phone] [facility] [phone]).          Post Anesthesia Home Care Instructions  Activity: Get plenty of rest for the remainder of the day. A responsible individual must stay with you for 24 hours following the procedure.  For the next 24 hours, DO NOT: -Drive a car -Advertising copywriter -Drink alcoholic beverages -Take any medication unless instructed by your physician -Make any legal decisions or sign important papers.  Meals: Start with liquid foods such as gelatin or soup. Progress to regular foods as tolerated. Avoid greasy, spicy, heavy foods. If nausea and/or vomiting occur, drink only clear liquids until the nausea and/or vomiting subsides. Call your physician if vomiting continues.  Special Instructions/Symptoms: Your throat may feel dry or sore from the anesthesia or the breathing tube placed in your throat during surgery. If this causes discomfort,  gargle with warm salt water. The discomfort should disappear within 24 hours.

## 2021-02-06 NOTE — Progress Notes (Signed)
Assisted Dr. Gavin Potters with ultrasound guided right popliteal block. Side rails up, monitors on throughout procedure. See vital signs in flow sheet. Tolerated Procedure well.

## 2021-02-06 NOTE — Anesthesia Procedure Notes (Signed)
Anesthesia Regional Block: Popliteal block   Pre-Anesthetic Checklist: , timeout performed,  Correct Patient, Correct Site, Correct Laterality,  Correct Procedure, Correct Position, site marked,  Risks and benefits discussed,  Surgical consent,  Pre-op evaluation,  At surgeon's request and post-op pain management  Laterality: Right  Prep: Dura Prep       Needles:  Injection technique: Single-shot  Needle Type: Echogenic Stimulator Needle     Needle Length: 10cm  Needle Gauge: 20     Additional Needles:   Procedures:,,,, ultrasound used (permanent image in chart),,    Narrative:  Start time: 02/06/2021 2:20 PM End time: 02/06/2021 2:24 PM Injection made incrementally with aspirations every 5 mL.  Performed by: Personally  Anesthesiologist: Atilano Median, DO  Additional Notes: Patient identified. Risks/Benefits/Options discussed with patient including but not limited to bleeding, infection, nerve damage, failed block, incomplete pain control. Patient expressed understanding and wished to proceed. All questions were answered. Sterile technique was used throughout the entire procedure. Please see nursing notes for vital signs. Aspirated in 5cc intervals with injection for negative confirmation. Patient was given instructions on fall risk and not to get out of bed. All questions and concerns addressed with instructions to call with any issues or inadequate analgesia.

## 2021-02-06 NOTE — H&P (Signed)
Reviewed chart and consent, discussed surgery with patient and she wishes to proceed today

## 2021-02-06 NOTE — Op Note (Signed)
Operative Report  Preop Diagnosis: Tailor's bunion deformity right foot  Postop Diagnosis: Same  Procedure: Repair tailor's bunion deformity with fifth metatarsal osteotomy and K wire fixation  Anesthesia: IV sedation with popliteal nerve block right lower extremity  Hemostasis: Ankle tourniquet at 250 mmHg  Blood Loss: 2 mL  Surgical Indications: Painful bunion deformity right foot causing pain and pressure wearing shoes consent signed in the chart outlining risk versus benefits  Procedures Performed: Patient is brought back to the OR placed in supine position right foot is prepped and draped in usual sterile manner tourniquet flighted to 250 mmHg.  Linear incision was made at the neck of the fifth metatarsal sharp and bluntly dissected through subcutaneous tissue down to the periosteum where the neck of the fifth metatarsal is transected using a sagittal saw through and through osteotomy performed capital fragment was then shifted medially to correct the deformity, K wire was driven percutaneously through the fifth toe lateral aspect alongside the capital fragment and into the canal of the fifth metatarsal back to the fifth TMT joint confirm location is in good position by fluoroscopy.  Area is flushed with sterile antibiotic solution closed in layers with 3-0 Vicryl and 4-0 Monocryl Xeroform gauze wet-to-dry dressing applied tourniquet is deflated patient is placed in compression bandage with Kerlix and Coban.  No complications we will see the patient back in the office in 1 week

## 2021-02-06 NOTE — Anesthesia Preprocedure Evaluation (Signed)
Anesthesia Evaluation  Patient identified by MRN, date of birth, ID band Patient awake    Reviewed: Allergy & Precautions, NPO status , Patient's Chart, lab work & pertinent test results  History of Anesthesia Complications (+) PONV  Airway Mallampati: II  TM Distance: >3 FB     Dental no notable dental hx.    Pulmonary neg pulmonary ROS,    Pulmonary exam normal        Cardiovascular negative cardio ROS   Rhythm:Regular Rate:Normal     Neuro/Psych Anxiety negative neurological ROS     GI/Hepatic Neg liver ROS, GERD  ,  Endo/Other  Hypothyroidism   Renal/GU CRFRenal disease  negative genitourinary   Musculoskeletal  (+) Arthritis , Osteoarthritis,  Right foot bunion   Abdominal Normal abdominal exam  (+)   Peds  Hematology   Anesthesia Other Findings   Reproductive/Obstetrics                             Anesthesia Physical Anesthesia Plan  ASA: 2  Anesthesia Plan: MAC and Regional   Post-op Pain Management:  Regional for Post-op pain   Induction: Intravenous  PONV Risk Score and Plan: 3 and Ondansetron, Dexamethasone, Propofol infusion, TIVA, Midazolam and Treatment may vary due to age or medical condition  Airway Management Planned: Simple Face Mask, Natural Airway and Nasal Cannula  Additional Equipment: None  Intra-op Plan:   Post-operative Plan:   Informed Consent: I have reviewed the patients History and Physical, chart, labs and discussed the procedure including the risks, benefits and alternatives for the proposed anesthesia with the patient or authorized representative who has indicated his/her understanding and acceptance.     Dental advisory given  Plan Discussed with: CRNA  Anesthesia Plan Comments:         Anesthesia Quick Evaluation

## 2021-02-07 ENCOUNTER — Encounter (HOSPITAL_BASED_OUTPATIENT_CLINIC_OR_DEPARTMENT_OTHER): Payer: Self-pay | Admitting: Podiatry

## 2021-02-07 NOTE — Anesthesia Postprocedure Evaluation (Signed)
Anesthesia Post Note  Patient: Regina Reyes  Procedure(s) Performed: REPAIR TAYLORS BUNION WITH MINIMAL INCISION -[place]wing FIXATION WITH OSTEOTOMY (Right: Toe)     Patient location during evaluation: PACU Anesthesia Type: Regional and MAC Level of consciousness: awake and alert Pain management: pain level controlled Vital Signs Assessment: post-procedure vital signs reviewed and stable Respiratory status: spontaneous breathing, nonlabored ventilation, respiratory function stable and patient connected to nasal cannula oxygen Cardiovascular status: stable and blood pressure returned to baseline Postop Assessment: no apparent nausea or vomiting Anesthetic complications: no   No notable events documented.  Last Vitals:  Vitals:   02/06/21 1615 02/06/21 1645  BP: (!) 107/96 122/68  Pulse: (!) 55 (!) 50  Resp: 15 16  Temp:  36.6 C  SpO2: 99% 100%    Last Pain:  Vitals:   02/06/21 1645  TempSrc:   PainSc: 0-No pain                 Belenda Cruise P 

## 2021-06-28 ENCOUNTER — Other Ambulatory Visit: Payer: Self-pay | Admitting: Internal Medicine

## 2021-06-28 DIAGNOSIS — E7800 Pure hypercholesterolemia, unspecified: Secondary | ICD-10-CM

## 2021-08-01 ENCOUNTER — Ambulatory Visit
Admission: RE | Admit: 2021-08-01 | Discharge: 2021-08-01 | Disposition: A | Discharge status: Home / Self Care | Payer: No Typology Code available for payment source | Source: Ambulatory Visit | Attending: Internal Medicine | Admitting: Internal Medicine

## 2021-08-01 DIAGNOSIS — E7800 Pure hypercholesterolemia, unspecified: Secondary | ICD-10-CM

## 2021-08-08 ENCOUNTER — Ambulatory Visit (HOSPITAL_COMMUNITY)
Admission: RE | Admit: 2021-08-08 | Discharge: 2021-08-08 | Disposition: A | Discharge status: Home / Self Care | Payer: Medicare Other | Source: Ambulatory Visit | Attending: Internal Medicine | Admitting: Internal Medicine

## 2021-08-08 ENCOUNTER — Other Ambulatory Visit (HOSPITAL_COMMUNITY): Payer: Self-pay | Admitting: Internal Medicine

## 2021-08-08 DIAGNOSIS — H539 Unspecified visual disturbance: Secondary | ICD-10-CM

## 2022-11-04 ENCOUNTER — Emergency Department (HOSPITAL_BASED_OUTPATIENT_CLINIC_OR_DEPARTMENT_OTHER)
Admission: EM | Admit: 2022-11-04 | Discharge: 2022-11-04 | Disposition: A | Discharge status: Home / Self Care | Payer: Medicare Other | Attending: Emergency Medicine | Admitting: Emergency Medicine

## 2022-11-04 ENCOUNTER — Emergency Department (HOSPITAL_BASED_OUTPATIENT_CLINIC_OR_DEPARTMENT_OTHER): Payer: Medicare Other

## 2022-11-04 ENCOUNTER — Encounter (HOSPITAL_BASED_OUTPATIENT_CLINIC_OR_DEPARTMENT_OTHER): Payer: Self-pay | Admitting: Emergency Medicine

## 2022-11-04 DIAGNOSIS — W010XXA Fall on same level from slipping, tripping and stumbling without subsequent striking against object, initial encounter: Secondary | ICD-10-CM | POA: Insufficient documentation

## 2022-11-04 DIAGNOSIS — Y92480 Sidewalk as the place of occurrence of the external cause: Secondary | ICD-10-CM | POA: Insufficient documentation

## 2022-11-04 DIAGNOSIS — M546 Pain in thoracic spine: Secondary | ICD-10-CM | POA: Insufficient documentation

## 2022-11-04 DIAGNOSIS — I700 Atherosclerosis of aorta: Secondary | ICD-10-CM | POA: Diagnosis not present

## 2022-11-04 DIAGNOSIS — R55 Syncope and collapse: Secondary | ICD-10-CM | POA: Diagnosis not present

## 2022-11-04 DIAGNOSIS — S50311A Abrasion of right elbow, initial encounter: Secondary | ICD-10-CM | POA: Insufficient documentation

## 2022-11-04 DIAGNOSIS — N189 Chronic kidney disease, unspecified: Secondary | ICD-10-CM | POA: Insufficient documentation

## 2022-11-04 DIAGNOSIS — Z905 Acquired absence of kidney: Secondary | ICD-10-CM | POA: Diagnosis not present

## 2022-11-04 DIAGNOSIS — K5730 Diverticulosis of large intestine without perforation or abscess without bleeding: Secondary | ICD-10-CM | POA: Diagnosis not present

## 2022-11-04 DIAGNOSIS — S59902A Unspecified injury of left elbow, initial encounter: Secondary | ICD-10-CM | POA: Diagnosis present

## 2022-11-04 DIAGNOSIS — Z91040 Latex allergy status: Secondary | ICD-10-CM | POA: Diagnosis not present

## 2022-11-04 LAB — CBC
HCT: 38.5 % (ref 36.0–46.0)
Hemoglobin: 12.8 g/dL (ref 12.0–15.0)
MCH: 30 pg (ref 26.0–34.0)
MCHC: 33.2 g/dL (ref 30.0–36.0)
MCV: 90.4 fL (ref 80.0–100.0)
Platelets: 259 10*3/uL (ref 150–400)
RBC: 4.26 MIL/uL (ref 3.87–5.11)
RDW: 13.7 % (ref 11.5–15.5)
WBC: 7.8 10*3/uL (ref 4.0–10.5)
nRBC: 0 % (ref 0.0–0.2)

## 2022-11-04 LAB — BASIC METABOLIC PANEL
Anion gap: 10 (ref 5–15)
BUN: 24 mg/dL — ABNORMAL HIGH (ref 8–23)
CO2: 26 mmol/L (ref 22–32)
Calcium: 9.5 mg/dL (ref 8.9–10.3)
Chloride: 102 mmol/L (ref 98–111)
Creatinine, Ser: 1.19 mg/dL — ABNORMAL HIGH (ref 0.44–1.00)
GFR, Estimated: 50 mL/min — ABNORMAL LOW (ref >60)
Glucose, Bld: 112 mg/dL — ABNORMAL HIGH (ref 70–99)
Potassium: 4.1 mmol/L (ref 3.5–5.1)
Sodium: 138 mmol/L (ref 135–145)

## 2022-11-04 LAB — TROPONIN I (HIGH SENSITIVITY)
Troponin I (High Sensitivity): 3 ng/L (ref <18)
Troponin I (High Sensitivity): 3 ng/L (ref <18)

## 2022-11-04 LAB — MAGNESIUM: Magnesium: 1.9 mg/dL (ref 1.7–2.4)

## 2022-11-04 MED ORDER — ACETAMINOPHEN 325 MG PO TABS
650.0000 mg | ORAL_TABLET | Freq: Once | ORAL | Status: AC
  Administered 2022-11-04: 650 mg via ORAL
  Filled 2022-11-04: qty 2

## 2022-11-04 MED ORDER — LIDOCAINE 5 % EX PTCH: 1.0000 | MEDICATED_PATCH | CUTANEOUS | 0 refills | Status: AC

## 2022-11-04 MED ORDER — IOHEXOL 300 MG/ML  SOLN
100.0000 mL | Freq: Once | INTRAMUSCULAR | Status: AC | PRN
  Administered 2022-11-04: 100 mL via INTRAVENOUS

## 2022-11-04 MED ORDER — LIDOCAINE 5 % EX PTCH
1.0000 | MEDICATED_PATCH | CUTANEOUS | Status: DC
  Administered 2022-11-04: 1 via TRANSDERMAL
  Filled 2022-11-04: qty 1

## 2022-11-04 NOTE — ED Triage Notes (Signed)
Pt reports she fell on a sidewalk; denies head injury, no thinners; denies syncope prior to fall, but now feels like she will pass out; c/o CP and back pain between shoulder blades w/ movement

## 2022-11-04 NOTE — ED Provider Notes (Signed)
[place] [facility] Provider Note   CSN: [id number] Arrival date & time: 11/04/22  1334     History  Chief Complaint  Patient presents with   Fall   Near Syncope        HPI Regina Reyes is a 67 y.o. female with CKD, GERD presenting for fall near-syncope. Occurred about an hour ago. States she was walking along the sidewalk tripped and fell and landed on her back.  Denies head injury or loss of consciousness.  Denies syncope or presyncopal symptoms leading up to the fall. States now her upper back hurts and feels like it radiates to her chest.  Denies chest pain.  Also reporting an abrasion on her right elbow which she states also hurts as well. Denies abdominal pain.   Fall  Near Syncope       Home Medications Prior to Admission medications   Medication Sig Start Date End Date Taking? Authorizing Provider  lidocaine (LIDODERM) 5 % Place 1 patch onto the skin daily. Remove & Discard patch within 12 hours or as directed by MD 11/04/22  Yes Gareth Eagle, PA-C  ALPRAZolam Prudy Feeler) 0.25 MG tablet Take 0.25 mg by mouth 2 (two) times daily as needed for anxiety.    [provider]  Azelaic Acid 15 % gel Apply topically daily. After skin is thoroughly washed and patted dry, gently but thoroughly massage a thin film of azelaic acid cream into the affected area twice daily, in the morning and evening.    [provider]  B Complex Vitamins (B COMPLEX PO) Take 1 capsule by mouth daily.    [provider]  CALCIUM-MAGNESIUM-VITAMIN D PO Take 3 tablets by mouth at bedtime.    [provider]  conjugated estrogens (PREMARIN) vaginal cream Place 1 applicator vaginally every other day.    [provider]  desonide (DESOWEN) 0.05 % ointment as needed. 02/01/11   [provider]  Estradiol-Norethindrone Acet 0.5-0.1 MG tablet Take 1 tablet by mouth daily.    [provider]  Multiple Vitamin  (MULTIVITAMIN) tablet Take 1 tablet by mouth daily.    [provider]  SYNTHROID 100 MCG tablet Take 100 mcg by mouth daily. 02/11/11   [provider]  tretinoin (RETIN-A) 0.01 % gel Apply topically at bedtime.    [provider]      Allergies    Nsaids, Penicillins, and Latex    Review of Systems   Review of Systems  Cardiovascular:  Positive for near-syncope.    Physical Exam   Vitals:   11/04/22 1700 11/04/22 1730  BP: (!) 143/89 (!) 143/89  Pulse:  93  Resp: 12 12  Temp:    SpO2: 99% 100%    CONSTITUTIONAL:  well-appearing, NAD NEURO:  Alert and oriented x 3, CN 3-12 grossly intact EYES:  eyes equal and reactive ENT/NECK:  Supple, no stridor  Chest: atraumatic, right lateral chest wall tenderness CARDIO:  Regular rate and rhythm, appears well-perfused  PULM:  No respiratory distress, CTAB GI/GU:  non-distended, soft MSK/SPINE:  No gross deformities, no edema, moves all extremities, abrasion about the right elbow noted.  Range of motion upper extremities and normal.  Range of motion of the back is normal.  No tenderness with flexion or extension.  No obvious deformities in the shoulders or upper back.  No step-offs of the spine.  No midline tenderness of the spine.  Did have some tenderness in the mid  right parasternal region with palpation SKIN:  no rash, atraumatic  *Additional and/or pertinent findings included in MDM below  ED Results / Procedures / Treatments   Labs (all labs ordered are listed, but only abnormal results are displayed) Labs Reviewed  BASIC METABOLIC PANEL - Abnormal; Notable for the following components:      Result Value   Glucose, Bld 112 (*)    BUN 24 (*)    Creatinine, Ser 1.19 (*)    GFR, Estimated 50 (*)    All other components within normal limits  CBC  MAGNESIUM  TROPONIN I (HIGH SENSITIVITY)  TROPONIN I (HIGH SENSITIVITY)    EKG EKG Interpretation Date/Time:  Sunday November 04 2022 16:02:15  EDT Ventricular Rate:  74 PR Interval:  147 QRS Duration:  109 QT Interval:  478 QTC Calculation: 409 R Axis:   -46  Text Interpretation: Sinus rhythm Ventricular bigeminy LAD, consider left anterior fascicular block Low voltage, precordial leads Confirmed by Pricilla Loveless [phone]) on 11/04/2022 4:06:16 PM  Radiology DG Elbow Complete Right  Result Date: 11/04/2022 CLINICAL DATA:  Fall with right posterior elbow pain EXAM: RIGHT ELBOW - COMPLETE 4 VIEW COMPARISON:  None Available. FINDINGS: There is no evidence of fracture, dislocation, or joint effusion. There is no evidence of arthropathy or other focal bone abnormality. Soft tissues are unremarkable. IMPRESSION: No acute fracture or dislocation. Electronically Signed   By: Agustin Cree M.D.   On: 11/04/2022 17:49   CT CHEST ABDOMEN PELVIS W CONTRAST  Result Date: 11/04/2022 CLINICAL DATA:  Fall with chest, epigastric and back pain EXAM: CT CHEST, ABDOMEN, AND PELVIS WITH CONTRAST TECHNIQUE: Multidetector CT imaging of the chest, abdomen and pelvis was performed following the standard protocol during bolus administration of intravenous contrast. RADIATION DOSE REDUCTION: This exam was performed according to the departmental dose-optimization program which includes automated exposure control, adjustment of the mA and/or kV according to patient size and/or use of iterative reconstruction technique. CONTRAST:  OMNIPAQUE IOHEXOL 300 MG/ML  SOLN COMPARISON:  CT abdomen and pelvis dated 04/22/2011 FINDINGS: CT CHEST FINDINGS Cardiovascular: Normal heart size. No significant pericardial fluid/thickening. Great vessels are normal in course and caliber. No central pulmonary emboli. Mediastinum/Nodes: Imaged thyroid gland without nodules meeting criteria for imaging follow-up by size. Normal esophagus. No pathologically enlarged axillary, supraclavicular, mediastinal, or hilar lymph nodes. Lungs/Pleura: The central airways are patent. No focal  consolidation. No pneumothorax. No pleural effusion. Musculoskeletal: No acute or abnormal lytic or blastic osseous lesions. CT ABDOMEN PELVIS FINDINGS Hepatobiliary: No focal hepatic lesions. No intra or extrahepatic biliary ductal dilation. Normal gallbladder. Pancreas: No focal lesions or main ductal dilation. Spleen: Normal in size without focal abnormality. Adrenals/Urinary Tract: No adrenal nodules. Status post left nephrectomy. No suspicious renal mass, calculi, or hydronephrosis. Suboptimal evaluation of the bladder due to beam hardening artifact from hip arthroplasties. Stomach/Bowel: Normal appearance of the stomach. No evidence of bowel wall thickening, distention, or inflammatory changes. Colonic diverticulosis without acute diverticulitis. Appendectomy. Vascular/Lymphatic: Aortic atherosclerosis. No enlarged abdominal or pelvic lymph nodes. Reproductive: No adnexal masses. Other: No free fluid, fluid collection, or free air. Nonspecific mildly increased mesenteric stranding associated with increased number and prominence of subcentimeter mesenteric lymph nodes. 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 No acute or abnormal lytic or blastic osseous findings. Multilevel degenerative changes of the lumbar spine. IMPRESSION: 1. No acute traumatic injury to the chest, abdomen, or pelvis. 2. Nonspecific mildly increased mesenteric stranding associated with increased number and prominence of subcentimeter mesenteric  lymph nodes. Findings are nonspecific but can be seen in the setting of mesenteric panniculitis. 3. Colonic diverticulosis without acute diverticulitis. 4. Status post left nephrectomy. 5.  Aortic Atherosclerosis (ICD10-I70.0). Electronically Signed   By: Agustin Cree M.D.   On: 11/04/2022 17:45   CT Head Wo Contrast  Result Date: 11/04/2022 CLINICAL DATA:  Fall EXAM: CT HEAD WITHOUT CONTRAST CT CERVICAL SPINE WITHOUT CONTRAST TECHNIQUE: Multidetector CT imaging of  the head and cervical spine was performed following the standard protocol without intravenous contrast. Multiplanar CT image reconstructions of the cervical spine were also generated. RADIATION DOSE REDUCTION: This exam was performed according to the departmental dose-optimization program which includes automated exposure control, adjustment of the mA and/or kV according to patient size and/or use of iterative reconstruction technique. COMPARISON:  None Available. FINDINGS: CT HEAD FINDINGS Brain: No evidence of acute infarction, hemorrhage, hydrocephalus, extra-axial collection or mass lesion/mass effect. Vascular: No hyperdense vessel or unexpected calcification. Skull: Normal. Negative for fracture or focal lesion. Sinuses/Orbits: The visualized paranasal sinuses are essentially clear. The mastoid air cells are unopacified. Other: None. CT CERVICAL SPINE FINDINGS Alignment: Normal cervical lordosis. Skull base and vertebrae: No acute fracture. No primary bone lesion or focal pathologic process. Soft tissues and spinal canal: No prevertebral fluid or swelling. No visible canal hematoma. Disc levels: Very mild degenerative changes at C5-6. Spinal canal is patent. Upper chest: Visualized lung apices are clear. Other: Visualized thyroid is unremarkable. IMPRESSION: Normal head CT. Normal cervical spine CT. Electronically Signed   By: Charline Bills M.D.   On: 11/04/2022 17:42   CT Cervical Spine Wo Contrast  Result Date: 11/04/2022 CLINICAL DATA:  Fall EXAM: CT HEAD WITHOUT CONTRAST CT CERVICAL SPINE WITHOUT CONTRAST TECHNIQUE: Multidetector CT imaging of the head and cervical spine was performed following the standard protocol without intravenous contrast. Multiplanar CT image reconstructions of the cervical spine were also generated. RADIATION DOSE REDUCTION: This exam was performed according to the departmental dose-optimization program which includes automated exposure control, adjustment of the mA and/or  kV according to patient size and/or use of iterative reconstruction technique. COMPARISON:  None Available. FINDINGS: CT HEAD FINDINGS Brain: No evidence of acute infarction, hemorrhage, hydrocephalus, extra-axial collection or mass lesion/mass effect. Vascular: No hyperdense vessel or unexpected calcification. Skull: Normal. Negative for fracture or focal lesion. Sinuses/Orbits: The visualized paranasal sinuses are essentially clear. The mastoid air cells are unopacified. Other: None. CT CERVICAL SPINE FINDINGS Alignment: Normal cervical lordosis. Skull base and vertebrae: No acute fracture. No primary bone lesion or focal pathologic process. Soft tissues and spinal canal: No prevertebral fluid or swelling. No visible canal hematoma. Disc levels: Very mild degenerative changes at C5-6. Spinal canal is patent. Upper chest: Visualized lung apices are clear. Other: Visualized thyroid is unremarkable. IMPRESSION: Normal head CT. Normal cervical spine CT. Electronically Signed   By: Charline Bills M.D.   On: 11/04/2022 17:42   DG Chest Portable 1 View  Result Date: 11/04/2022 CLINICAL DATA:  Fall onto right side EXAM: PORTABLE CHEST 1 VIEW COMPARISON:  Chest radiograph dated 12/05/2014 FINDINGS: Normal lung volumes. No focal consolidations. No pleural effusion or pneumothorax. The heart size and mediastinal contours are within normal limits. No radiographic finding of acute displaced fracture. IMPRESSION: No radiographic finding of acute displaced fracture. Electronically Signed   By: Agustin Cree M.D.   On: 11/04/2022 15:51    Procedures Procedures    Medications Ordered in ED Medications  lidocaine (LIDODERM) 5 % 1 patch (has no administration in  time range)  acetaminophen (TYLENOL) tablet 650 mg (650 mg Oral Given 11/04/22 1616)  iohexol (OMNIPAQUE) 300 MG/ML solution 100 mL (100 mLs Intravenous Contrast Given 11/04/22 1629)    ED Course/ Medical Decision Making/ A&P                                  Medical Decision Making Amount and/or Complexity of Data Reviewed Labs: ordered. Radiology: ordered.  Risk OTC drugs. Prescription drug management.   67 year old well-appearing female presenting for mechanical fall occurred earlier today. Exam was largely unremarkable but did reveal small abrasion about the right elbow.  Patient initially endorsed back pain radiating to the chest.  This raise concern for traumatic aortic injury or other traumatic chest abdomen or pelvic injury.  Fortunately CT scans and x-rays were negative for acute findings.  Patient endorsed some improvement of her pain after treatment with Tylenol.  Suspect MSK injury to the right mid back.  Workup does not suggest spinal injury or cord involvement.  Sent Lidoderm patches to her pharmacy.  Advised her to follow-up with her PCP.  Vital stable.  Discharged home in good condition.        Final Clinical Impression(s) / ED Diagnoses Final diagnoses:  Acute right-sided thoracic back pain    Rx / DC Orders ED Discharge Orders          Ordered    lidocaine (LIDODERM) 5 %  Every 24 hours        11/04/22 1816              Breckin, Brun, PA-C 11/04/22 1817    Pricilla Loveless, MD 11/07/22 [phone]

## 2022-11-04 NOTE — Discharge Instructions (Signed)
Evaluation today was overall reassuring.  Suspect your back pain is related to muscle injury sustained in the fall.  Treatment is conservative at this time.  Recommend you follow-up with your PCP if your symptoms persist.  Sent Lidoderm patches to your pharmacy.  You can also take Tylenol as needed for pain.

## 2022-11-04 NOTE — ED Notes (Signed)
Pt hooked to telemetry, found to be in bigeminy. EKG done and given to provider.

## 2022-11-04 NOTE — ED Notes (Signed)
Pt advised she fell and only got dizzy after landing and sitting there in pain. Has not been dizzy since. Pt has full ROM of shoulders and elbows. Hematoma to R elbow, no active external hemorrhage. Tenderness to ribs, and "stabbing pain through to her [epigastric region] where she points".

## 2022-12-27 IMAGING — CT CT CARDIAC CORONARY ARTERY CALCIUM SCORE
3 series · 14 of 20 positions shown, 16 images · non-contrast
Comparison: None Available.

CLINICAL DATA: Hyperlipidemia

EXAM:
CT CARDIAC CORONARY ARTERY CALCIUM SCORE
TECHNIQUE: Non-contrast imaging through the heart was performed using
prospective ECG gating. Image post processing was performed on an
independent workstation, allowing for quantitative analysis of the
heart and coronary arteries. Note that this exam targets the heart
and the chest was not imaged in its entirety.

[Series 2: calcium scoring 2.00 qr36 bestdiast 70% hrt calciu · axial · 0.43mm/px · z∈[+1615,+1709]mm · 4 of 79 slices shown]
[im 16/79  vessel]
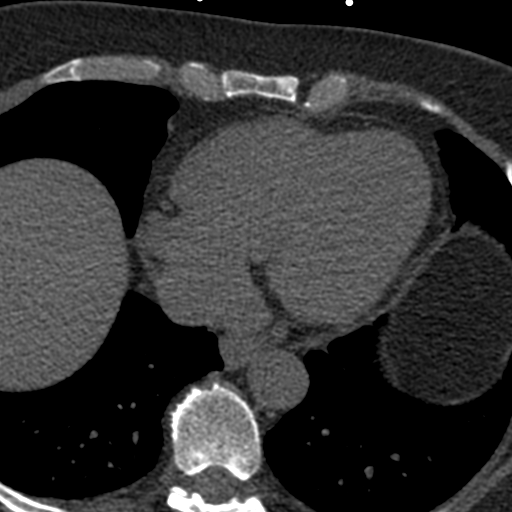
[im 32/79  vessel]
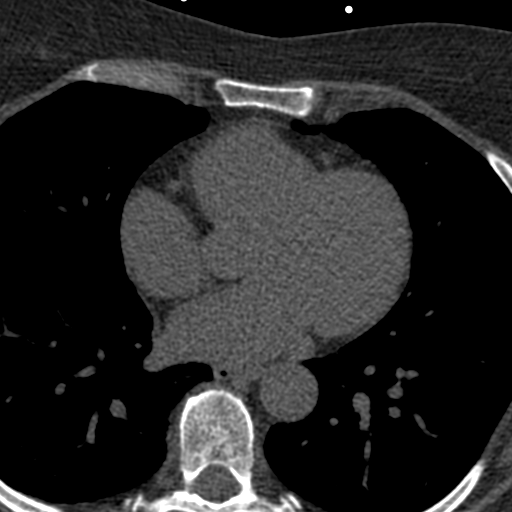
[im 47/79  vessel]
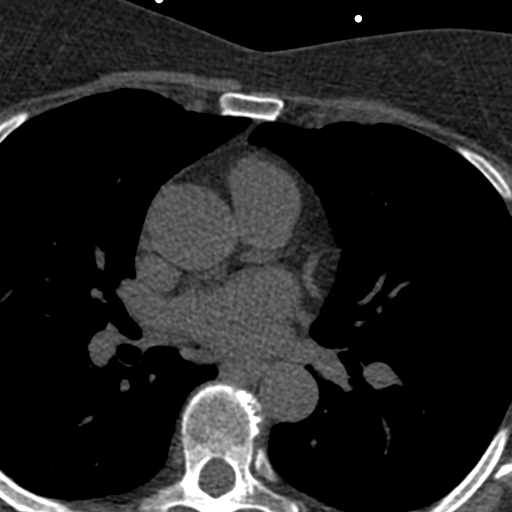
[im 63/79  vessel]
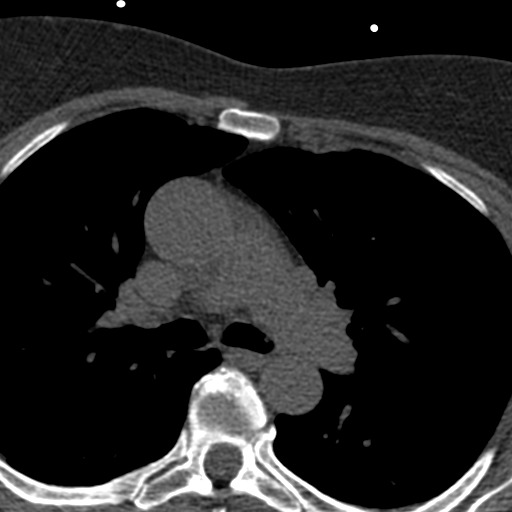

[Series 3: calcium scoring 2.00 br40 bestdiast 70% axial · axial · 0.58mm/px · z∈[+1601,+1713]mm · 5 of 85 slices shown, 7 images]
[im 15/85  vessel]
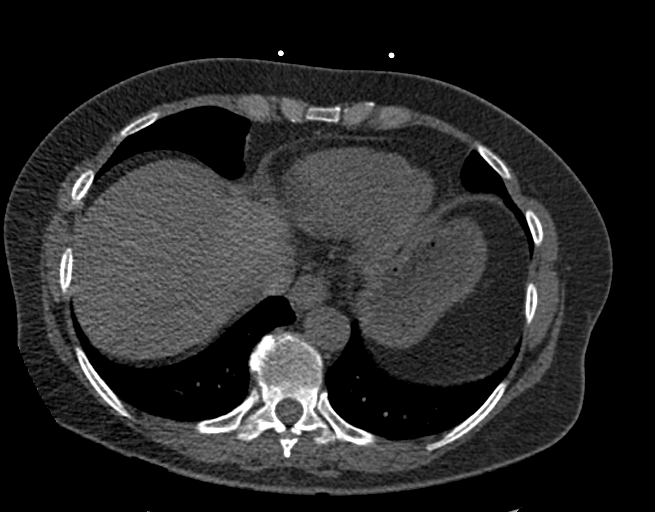
[im 15/85  lung]
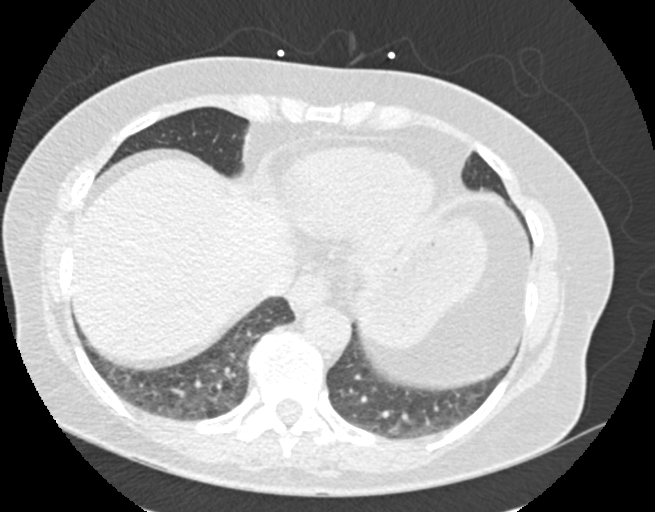
[im 29/85  vessel]
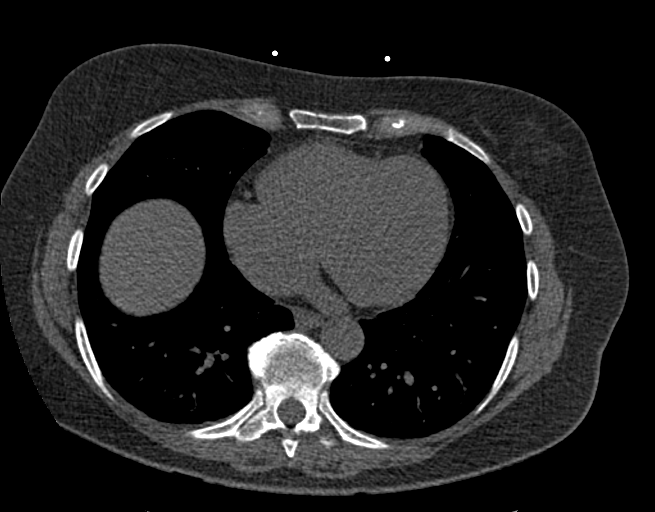
[im 43/85  vessel]
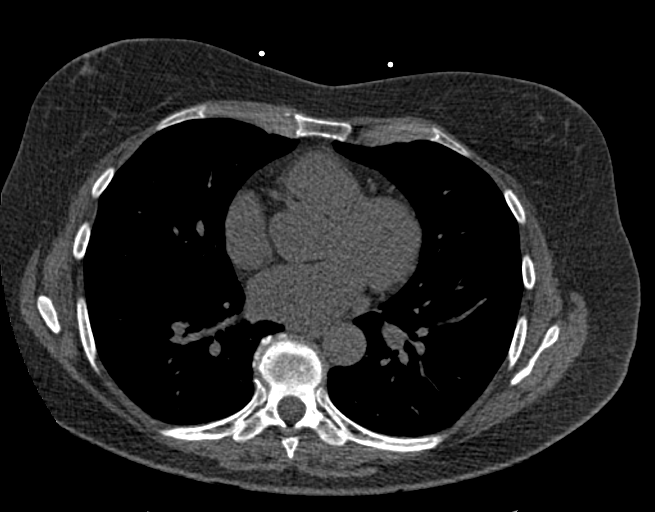
[im 57/85  vessel]
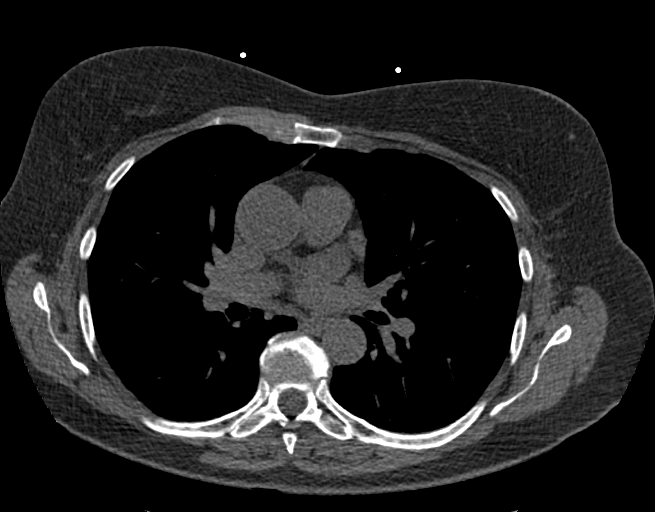
[im 71/85  vessel]
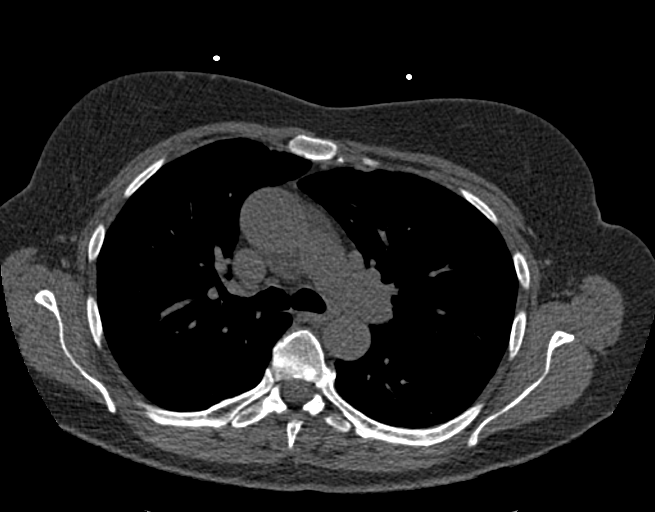
[im 71/85  lung]
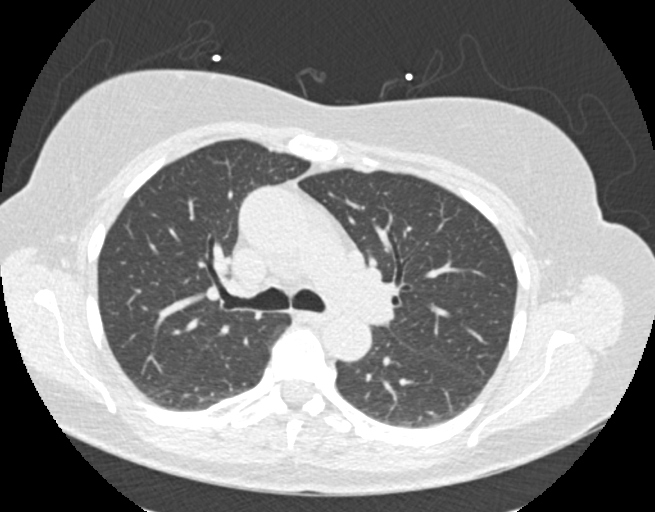

[Series 9: calcium scoring 2.00 br60 bestdiast 70% lungs · axial · 0.58mm/px · z∈[+1601,+1713]mm · 5 of 85 slices shown]
[im 15/85  vessel]
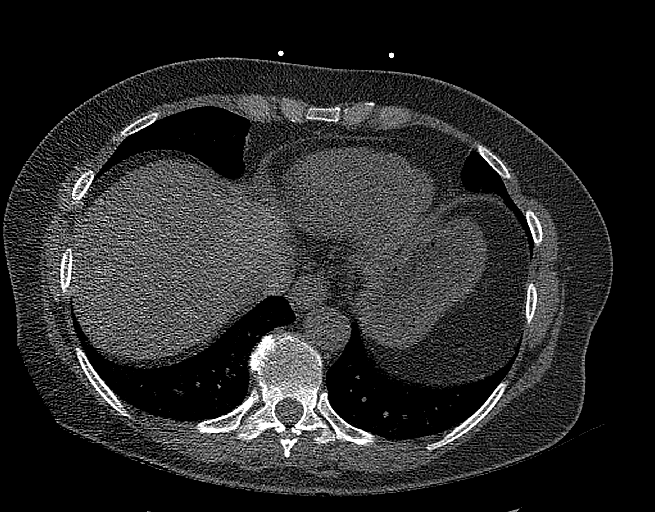
[im 29/85  vessel]
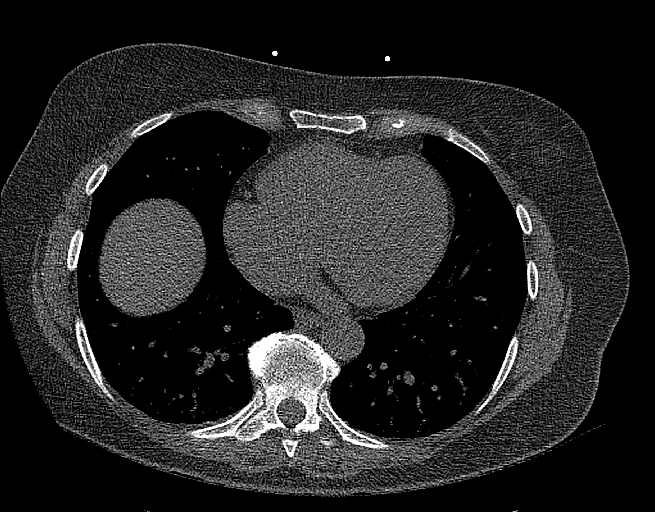
[im 43/85  vessel]
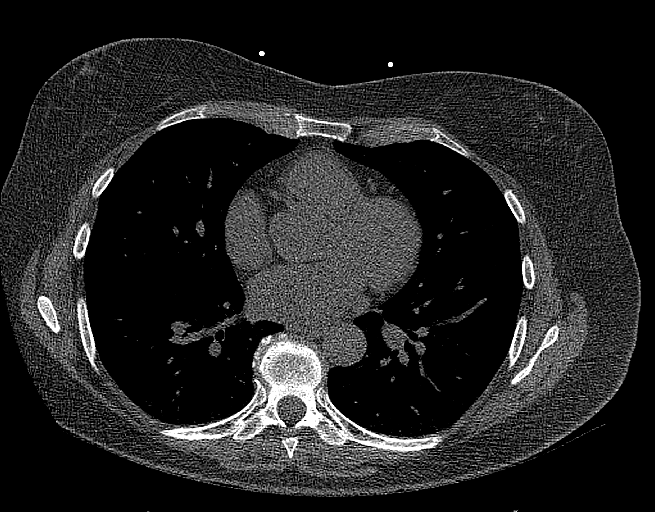
[im 57/85  vessel]
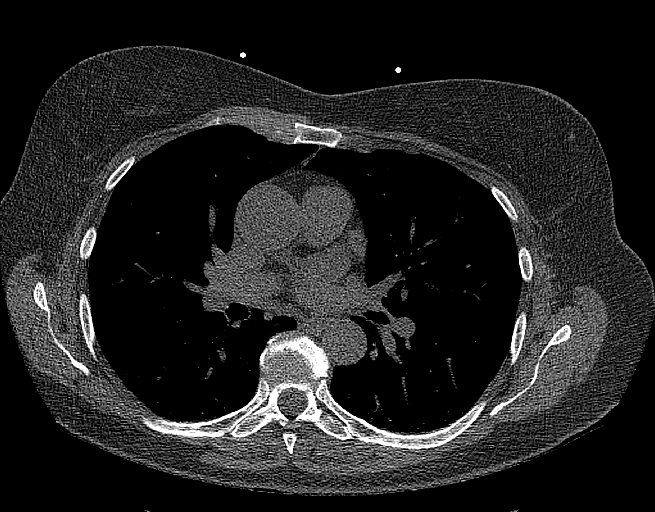
[im 71/85  vessel]
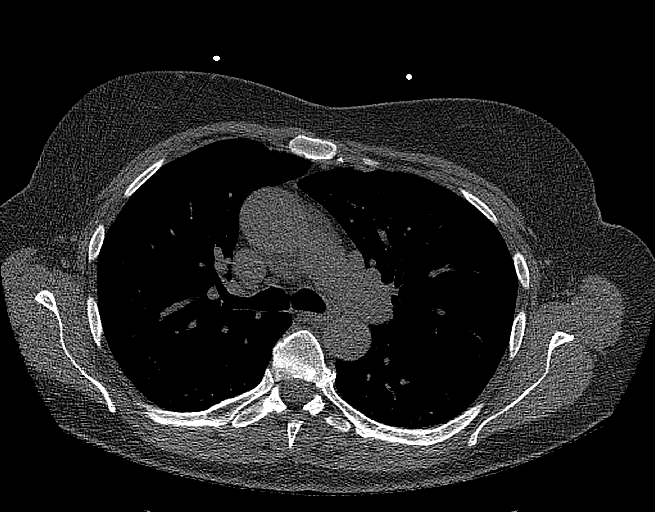

[14 of 20 positions shown; findings below may reference images not displayed]

FINDINGS: CORONARY CALCIUM SCORES:

Left Main: 0

LAD: 0

LCx: 0

RCA: 0

Total Agatston Score: 0

[HOSPITAL] percentile: 0

AORTA MEASUREMENTS:

Ascending Aorta: 36 mm

Descending Aorta: 24 mm

OTHER FINDINGS:

Heart is normal size. Aorta normal caliber. Scattered punctate
calcifications in the aortic arch. No adenopathy. No confluent
opacities or effusions. No acute findings in the upper abdomen.
Chest wall soft tissues are unremarkable. No acute bony abnormality.
IMPRESSION: No visible coronary artery calcifications. Total coronary calcium
score of 0.

Scattered punctate calcifications in the aortic arch.

No acute extra cardiac abnormality.
